# Patient Record
Sex: Female | Born: 1950 | Race: Black or African American | Hispanic: No | Marital: Single | Smoking: Never smoker
Health system: Southern US, Community
[De-identification: ages and names within clinical notes are randomized; demographics above are authoritative.]

## PROBLEM LIST (undated history)

## (undated) DIAGNOSIS — K5730 Diverticulosis of large intestine without perforation or abscess without bleeding: Secondary | ICD-10-CM

## (undated) DIAGNOSIS — E785 Hyperlipidemia, unspecified: Secondary | ICD-10-CM

## (undated) DIAGNOSIS — I10 Essential (primary) hypertension: Secondary | ICD-10-CM

## (undated) HISTORY — DX: Hyperlipidemia, unspecified: E78.5

## (undated) HISTORY — DX: Diverticulosis of large intestine without perforation or abscess without bleeding: K57.30

## (undated) HISTORY — DX: Essential (primary) hypertension: I10

---

## 2003-11-10 ENCOUNTER — Ambulatory Visit (HOSPITAL_COMMUNITY): Admission: RE | Admit: 2003-11-10 | Discharge: 2003-11-10 | Payer: Self-pay | Admitting: Internal Medicine

## 2007-11-15 ENCOUNTER — Emergency Department (HOSPITAL_COMMUNITY): Admission: EM | Admit: 2007-11-15 | Discharge: 2007-11-15 | Payer: Self-pay | Admitting: Emergency Medicine

## 2008-03-17 ENCOUNTER — Ambulatory Visit (HOSPITAL_COMMUNITY): Admission: RE | Admit: 2008-03-17 | Discharge: 2008-03-17 | Payer: Self-pay | Admitting: *Deleted

## 2010-09-01 ENCOUNTER — Emergency Department (HOSPITAL_COMMUNITY)
Admission: EM | Admit: 2010-09-01 | Discharge: 2010-09-01 | Disposition: A | Discharge status: Home / Self Care | Payer: BC Managed Care – PPO | Attending: Emergency Medicine | Admitting: Emergency Medicine

## 2010-09-01 ENCOUNTER — Emergency Department (HOSPITAL_COMMUNITY): Payer: BC Managed Care – PPO

## 2010-09-01 DIAGNOSIS — I10 Essential (primary) hypertension: Secondary | ICD-10-CM | POA: Insufficient documentation

## 2010-09-01 DIAGNOSIS — Z79899 Other long term (current) drug therapy: Secondary | ICD-10-CM | POA: Insufficient documentation

## 2010-09-01 DIAGNOSIS — M25569 Pain in unspecified knee: Secondary | ICD-10-CM | POA: Insufficient documentation

## 2010-09-01 DIAGNOSIS — G8929 Other chronic pain: Secondary | ICD-10-CM | POA: Insufficient documentation

## 2010-11-23 NOTE — Op Note (Signed)
NAME:  Elaine Houston, Elaine Houston                        ACCOUNT NO.:  [id number]   MEDICAL RECORD NO.:  [id number]                   PATIENT TYPE:  AMB   LOCATION:  DAY                                  FACILITY:  [facility]   PHYSICIAN:  R. Roetta Sessions, M.D.              DATE OF BIRTH:  04-Jan-1951   DATE OF PROCEDURE:  11/10/2003  DATE OF DISCHARGE:                                 OPERATIVE REPORT   PROCEDURE:  Screening colonoscopy.   ENDOSCOPIST:  Gerrit Friends. Rourk, M.D.   INDICATIONS FOR PROCEDURE:  The patient is a 60 year old lady seen over at  the courtesy of Dr. Nicoletta Ba for colorectal cancer screening.  She is  devoid of any lower GI tract symptoms.  She has never had her colon imaged  previously.  There is no family history of colorectal neoplasia.  Colonoscopy is now being done as a screening maneuver.  This approach has  been discussed with the patient at length.  The potential risks, benefits,  and alternatives have been reviewed; questions answered.  She is agreeable.  Please see my handwritten H&P.   PROCEDURE NOTE:  O2 saturation, blood pressure, pulse and respirations were  monitored throughout the entirety of the  procedure.  Conscious sedation:  Versed 3 mg IV, Demerol 75 mg IV in divided doses.   INSTRUMENT:  Olympus video chip adult colonoscope.   FINDINGS:  Digital rectal exam revealed no abnormalities.   ENDOSCOPIC FINDINGS:  It was unfortunately suboptimal in the transverse and  right colon.   RECTUM:  Examination of the rectal mucosa including the retroflex view of  the anal verge revealed no abnormalities.   COLON:  The colonic mucosa was surveyed from the rectosigmoid junction  through the left transverse and right colon to the area of the appendiceal  orifice, ileocecal valve, and cecum.  These structures were well seen and  photographed for the record.   From this level the scope was slowly withdrawn.  All previously mentioned  mucosal surfaces were  again seen.  There was chunky vegetable matter in the  transverse and right colon which had to be washed and suctioned.  This  material repeatedly clogged up the suction channel on the scope.  The  colonic mucosa was fairly well seen; however, all of the fecal debris could  not be cleared.  The patient was noted to have sigmoid diverticula, but no  other gross abnormalities were observed.  The patient tolerated the  procedure well and was reacted in endoscopy.   IMPRESSION:  1. Normal rectum.  2. Sigmoid diverticula.  3. The remainder of the colonic mucosa appeared normal; however, suboptimal     prep made the exam more difficult.   RECOMMENDATIONS:  1. Yearly rectal exams/Hemoccults at the time of her annual physical.  2. I have recommended that she come back somewhat early for her next     screening colonoscopy i.e. 5  years.  3. She is to follow up with Dr. Milinda Cave as directed by him.      ___________________________________________                                            Jonathon Bellows, M.D.   RMR/MEDQ  D:  11/10/2003  T:  11/10/2003  Job:  [id number]   cc:   Jeoffrey Massed, M.D.  [address]  [place]  [place] [address]  Fax: [phone]   R. Roetta Sessions, M.D.  [address]  [place]  [place] [address]  Fax: [phone]

## 2012-05-19 ENCOUNTER — Encounter: Payer: Self-pay | Admitting: Family Medicine

## 2012-05-19 ENCOUNTER — Ambulatory Visit (INDEPENDENT_AMBULATORY_CARE_PROVIDER_SITE_OTHER): Payer: BC Managed Care – PPO | Admitting: Family Medicine

## 2012-05-19 VITALS — BP 160/90 | HR 74 | Resp 15 | Ht 62.5 in | Wt 202.0 lb

## 2012-05-19 DIAGNOSIS — E785 Hyperlipidemia, unspecified: Secondary | ICD-10-CM

## 2012-05-19 DIAGNOSIS — Z23 Encounter for immunization: Secondary | ICD-10-CM

## 2012-05-19 DIAGNOSIS — M179 Osteoarthritis of knee, unspecified: Secondary | ICD-10-CM | POA: Insufficient documentation

## 2012-05-19 DIAGNOSIS — M1710 Unilateral primary osteoarthritis, unspecified knee: Secondary | ICD-10-CM

## 2012-05-19 DIAGNOSIS — E669 Obesity, unspecified: Secondary | ICD-10-CM

## 2012-05-19 DIAGNOSIS — IMO0002 Reserved for concepts with insufficient information to code with codable children: Secondary | ICD-10-CM

## 2012-05-19 DIAGNOSIS — I10 Essential (primary) hypertension: Secondary | ICD-10-CM

## 2012-05-19 NOTE — Patient Instructions (Addendum)
Continue current medications I will obtain records Nice to meet you! F/U 3 months

## 2012-05-19 NOTE — Progress Notes (Signed)
  Subjective:    Patient ID: Elaine Houston, female    DOB: 1950/10/24, 61 y.o.   MRN: [id number]  HPI Patient here to establish care. Previous PCP Dr. Stephania Fragmin [facility]. Medications and history reviewed no specific concerns today. Hypertension for the past 20 years. She states her blood pressure is well-controlled on her medications however she did not take them this morning. Hyperlipidemia on simvastatin. She has osteoarthritis of her left knee which she takes Naprosyn for some time she gets pain up to her hip and down into her ankle. She works second shift at [employer].  Colonoscopy - 2005, needed repeat in 5 years Mammograms done at [facility] at her job PAP Smear UTD TDAP given 2009  Review of Systems  GEN- denies fatigue, fever, weight loss,weakness, recent illness HEENT- denies eye drainage, change in vision, nasal discharge, CVS- denies chest pain, palpitations RESP- denies SOB, cough, wheeze ABD- denies N/V, change in stools, abd pain GU- denies dysuria, hematuria, dribbling, incontinence MSK- denies joint pain, muscle aches, injury Neuro- denies headache, dizziness, syncope, seizure activity      Objective:   Physical Exam GEN- NAD, alert and oriented x3 HEENT- PERRL, EOMI, non injected sclera, pink conjunctiva, MMM, oropharynx clear, dentures Neck- Supple,  CVS- RRR, no murmur RESP-CTAB ABS-NABS,soft,NT,ND EXT- No edema Pulses- Radial, DP- 2+ Psych-normal affect and mood        Assessment & Plan:

## 2012-05-19 NOTE — Assessment & Plan Note (Signed)
Continue BP meds, no meds taken this AM  Obtain records

## 2012-05-19 NOTE — Assessment & Plan Note (Signed)
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

## 2012-05-19 NOTE — Assessment & Plan Note (Signed)
Continue statin therapy.

## 2012-05-29 ENCOUNTER — Telehealth: Payer: Self-pay | Admitting: Family Medicine

## 2012-05-29 MED ORDER — SIMVASTATIN 20 MG PO TABS: 20.0000 mg | ORAL_TABLET | Freq: Every evening | ORAL | Status: DC

## 2012-05-29 MED ORDER — HYDROCHLOROTHIAZIDE 25 MG PO TABS: 25.0000 mg | ORAL_TABLET | Freq: Every day | ORAL | Status: DC

## 2012-05-29 NOTE — Telephone Encounter (Signed)
meds sent

## 2012-08-18 ENCOUNTER — Ambulatory Visit: Payer: BC Managed Care – PPO | Admitting: Family Medicine

## 2012-09-01 ENCOUNTER — Encounter: Payer: Self-pay | Admitting: Family Medicine

## 2012-09-01 ENCOUNTER — Ambulatory Visit (INDEPENDENT_AMBULATORY_CARE_PROVIDER_SITE_OTHER): Payer: BC Managed Care – PPO | Admitting: Family Medicine

## 2012-09-01 VITALS — BP 134/78 | HR 60 | Resp 18 | Ht 62.5 in | Wt 204.1 lb

## 2012-09-01 DIAGNOSIS — Z1211 Encounter for screening for malignant neoplasm of colon: Secondary | ICD-10-CM

## 2012-09-01 DIAGNOSIS — E785 Hyperlipidemia, unspecified: Secondary | ICD-10-CM

## 2012-09-01 DIAGNOSIS — M1710 Unilateral primary osteoarthritis, unspecified knee: Secondary | ICD-10-CM

## 2012-09-01 DIAGNOSIS — Z1321 Encounter for screening for nutritional disorder: Secondary | ICD-10-CM

## 2012-09-01 DIAGNOSIS — IMO0002 Reserved for concepts with insufficient information to code with codable children: Secondary | ICD-10-CM

## 2012-09-01 DIAGNOSIS — R0981 Nasal congestion: Secondary | ICD-10-CM

## 2012-09-01 DIAGNOSIS — E669 Obesity, unspecified: Secondary | ICD-10-CM

## 2012-09-01 DIAGNOSIS — I10 Essential (primary) hypertension: Secondary | ICD-10-CM

## 2012-09-01 DIAGNOSIS — Z130 Encounter for screening for diseases of the blood and blood-forming organs and certain disorders involving the immune mechanism: Secondary | ICD-10-CM

## 2012-09-01 DIAGNOSIS — J3489 Other specified disorders of nose and nasal sinuses: Secondary | ICD-10-CM

## 2012-09-01 DIAGNOSIS — M179 Osteoarthritis of knee, unspecified: Secondary | ICD-10-CM

## 2012-09-01 LAB — COMPREHENSIVE METABOLIC PANEL
ALT: 12 U/L (ref 0–35)
AST: 13 U/L (ref 0–37)
Albumin: 3.9 g/dL (ref 3.5–5.2)
Alkaline Phosphatase: 70 U/L (ref 39–117)
BUN: 14 mg/dL (ref 6–23)
CO2: 28 mEq/L (ref 19–32)
Calcium: 9.3 mg/dL (ref 8.4–10.5)
Chloride: 103 mEq/L (ref 96–112)
Creat: 0.62 mg/dL (ref 0.50–1.10)
Glucose, Bld: 99 mg/dL (ref 70–99)
Potassium: 3.8 mEq/L (ref 3.5–5.3)
Sodium: 139 mEq/L (ref 135–145)
Total Bilirubin: 0.4 mg/dL (ref 0.3–1.2)
Total Protein: 7.3 g/dL (ref 6.0–8.3)

## 2012-09-01 LAB — CBC
HCT: 34 % — ABNORMAL LOW (ref 36.0–46.0)
Hemoglobin: 11.5 g/dL — ABNORMAL LOW (ref 12.0–15.0)
MCH: 27.2 pg (ref 26.0–34.0)
MCHC: 33.8 g/dL (ref 30.0–36.0)
MCV: 80.4 fL (ref 78.0–100.0)
Platelets: 243 10*3/uL (ref 150–400)
RBC: 4.23 MIL/uL (ref 3.87–5.11)
RDW: 16.4 % — ABNORMAL HIGH (ref 11.5–15.5)
WBC: 6.3 10*3/uL (ref 4.0–10.5)

## 2012-09-01 LAB — LIPID PANEL
Cholesterol: 220 mg/dL — ABNORMAL HIGH (ref 0–200)
HDL: 63 mg/dL (ref >39)
LDL Cholesterol: 145 mg/dL — ABNORMAL HIGH (ref 0–99)
Total CHOL/HDL Ratio: 3.5 Ratio
Triglycerides: 60 mg/dL (ref <150)
VLDL: 12 mg/dL (ref 0–40)

## 2012-09-01 MED ORDER — SIMVASTATIN 20 MG PO TABS: 20.0000 mg | ORAL_TABLET | Freq: Every evening | ORAL | Status: DC

## 2012-09-01 MED ORDER — NAPROXEN 375 MG PO TABS: 375.0000 mg | ORAL_TABLET | Freq: Two times a day (BID) | ORAL | Status: DC

## 2012-09-01 MED ORDER — HYDROCHLOROTHIAZIDE 25 MG PO TABS: 25.0000 mg | ORAL_TABLET | Freq: Every day | ORAL | Status: AC

## 2012-09-01 MED ORDER — ENALAPRIL MALEATE 5 MG PO TABS: 5.0000 mg | ORAL_TABLET | Freq: Every day | ORAL | Status: AC

## 2012-09-01 NOTE — Assessment & Plan Note (Signed)
Well-controlled with Naprosyn and encouraged her to walk some for exercise. She will also start calcium and vitamin D once a day as she's had some constipation with this in the past

## 2012-09-01 NOTE — Patient Instructions (Addendum)
Start calcium and Vitamin D - 1 tablet a day / or look for multivitamin that has calcium (500mg ) and Vitamin D (400iU) Continue current medication Start walking some for exercise and weight loss Get the labs done fasting Nasal saline for sinuses Referral for repeat colonoscopy  F/U 4 months for Physical

## 2012-09-01 NOTE — Assessment & Plan Note (Signed)
Her symptoms have improved, advise her to use nasal saline

## 2012-09-01 NOTE — Assessment & Plan Note (Signed)
Continue Zocor she's to have fasting labs done

## 2012-09-01 NOTE — Assessment & Plan Note (Signed)
Discussed weight and need for increased activity which will also help her arthritis

## 2012-09-01 NOTE — Assessment & Plan Note (Signed)
Blood pressure is well-controlled we'll continue current medications she will have fasting labs done

## 2012-09-01 NOTE — Progress Notes (Signed)
  Subjective:    Patient ID: Elaine Houston, female    DOB: 1951-02-16, 62 y.o.   MRN: [id number]  HPI  Patient presents to follow chronic medical problems. She has had some sinus congestion and drainage over the past month but it is now improved. She is tolerating her blood pressure medications. She is due for fasting labs. She is to have mammogram at her work this in March She is due for colonoscopy  Review of Systems   GEN- denies fatigue, fever, weight loss,weakness, recent illness HEENT- denies eye drainage, change in vision, nasal discharge, CVS- denies chest pain, palpitations RESP- denies SOB, cough, wheeze ABD- denies N/V, change in stools, abd pain GU- denies dysuria, hematuria, dribbling, incontinence MSK- denies joint pain, muscle aches, injury Neuro- denies headache, dizziness, syncope, seizure activity      Objective:   Physical Exam  GEN- NAD, alert and oriented x3 HEENT- PERRL, EOMI, non injected sclera, pink conjunctiva, MMM, oropharynx clear, TM clear bilat, nares clear bilat, no sinus tenderness Neck- Supple, no bruit CVS- RRR, no murmur RESP-CTAB EXT- trace pedal edema LLE, no edema RLE  Pulses- Radial, DP- 2+       Assessment & Plan:

## 2012-09-05 LAB — VITAMIN D 1,25 DIHYDROXY
Vitamin D 1, 25 (OH)2 Total: 63 pg/mL (ref 18–72)
Vitamin D2 1, 25 (OH)2: <8 pg/mL
Vitamin D3 1, 25 (OH)2: 63 pg/mL

## 2012-09-07 MED ORDER — SIMVASTATIN 40 MG PO TABS: 40.0000 mg | ORAL_TABLET | Freq: Every evening | ORAL | Status: AC

## 2012-09-07 NOTE — Addendum Note (Signed)
Addended by: Milinda Antis F on: 09/07/2012 10:33 AM   Modules accepted: Orders

## 2012-09-14 ENCOUNTER — Telehealth: Payer: Self-pay | Admitting: Family Medicine

## 2012-09-14 NOTE — Telephone Encounter (Signed)
Called and left message for patient to return call.  

## 2012-09-15 ENCOUNTER — Telehealth: Payer: Self-pay

## 2012-09-15 NOTE — Telephone Encounter (Signed)
Pt was referred by Dr. Jeanice Lim for screening colonoscopy. Last one was 11/10/2003 by RMR and next was recommended in 5 years. OV with Gerrit Halls, NP on 10/12/2012 at 10:30 AM.

## 2012-10-12 ENCOUNTER — Ambulatory Visit: Payer: BC Managed Care – PPO | Admitting: Gastroenterology

## 2012-11-05 ENCOUNTER — Encounter (HOSPITAL_COMMUNITY): Payer: Self-pay | Admitting: *Deleted

## 2012-11-05 ENCOUNTER — Emergency Department (HOSPITAL_COMMUNITY): Payer: BC Managed Care – PPO

## 2012-11-05 ENCOUNTER — Emergency Department (HOSPITAL_COMMUNITY)
Admission: EM | Admit: 2012-11-05 | Discharge: 2012-11-05 | Disposition: A | Discharge status: Home / Self Care | Payer: BC Managed Care – PPO | Attending: Emergency Medicine | Admitting: Emergency Medicine

## 2012-11-05 DIAGNOSIS — E785 Hyperlipidemia, unspecified: Secondary | ICD-10-CM | POA: Insufficient documentation

## 2012-11-05 DIAGNOSIS — Z79899 Other long term (current) drug therapy: Secondary | ICD-10-CM | POA: Insufficient documentation

## 2012-11-05 DIAGNOSIS — I10 Essential (primary) hypertension: Secondary | ICD-10-CM | POA: Insufficient documentation

## 2012-11-05 DIAGNOSIS — Y9389 Activity, other specified: Secondary | ICD-10-CM | POA: Insufficient documentation

## 2012-11-05 DIAGNOSIS — Z8719 Personal history of other diseases of the digestive system: Secondary | ICD-10-CM | POA: Insufficient documentation

## 2012-11-05 DIAGNOSIS — Y9289 Other specified places as the place of occurrence of the external cause: Secondary | ICD-10-CM | POA: Insufficient documentation

## 2012-11-05 DIAGNOSIS — W010XXA Fall on same level from slipping, tripping and stumbling without subsequent striking against object, initial encounter: Secondary | ICD-10-CM | POA: Insufficient documentation

## 2012-11-05 DIAGNOSIS — S99929A Unspecified injury of unspecified foot, initial encounter: Secondary | ICD-10-CM | POA: Insufficient documentation

## 2012-11-05 DIAGNOSIS — S8992XA Unspecified injury of left lower leg, initial encounter: Secondary | ICD-10-CM

## 2012-11-05 DIAGNOSIS — S8990XA Unspecified injury of unspecified lower leg, initial encounter: Secondary | ICD-10-CM | POA: Insufficient documentation

## 2012-11-05 MED ORDER — HYDROCODONE-ACETAMINOPHEN 5-325 MG PO TABS: 1.0000 | ORAL_TABLET | ORAL | Status: DC | PRN

## 2012-11-05 NOTE — ED Notes (Signed)
Tripped and fell going up steps, on Tuesday.  Pain lt knee and lower leg.

## 2012-11-05 NOTE — ED Provider Notes (Signed)
History     CSN: [id number]  Arrival date & time 11/05/12  2030   First MD Initiated Contact with Patient 11/05/12 2257      Chief Complaint  Patient presents with  . Leg Pain    (Consider location/radiation/quality/duration/timing/severity/associated sxs/prior treatment) HPI Elaine Houston is a 62 y.o. female who presents to the ED with left knee pain. She states she was going up steps 2 days ago and fell and hit her knee on the step. She complains of pain in the knee that radiates to the lower leg. She denies any other injuries.   Past Medical History  Diagnosis Date  . Hypertension   . Hyperlipidemia   . Sigmoid diverticulosis     Past Surgical History  Procedure Laterality Date  . Cesarean section      Family History  Problem Relation Age of Onset  . Diabetes Mother   . Arthritis Father   . Heart disease Father   . Diabetes Sister   . Diabetes Sister     History  Substance Use Topics  . Smoking status: Never Smoker   . Smokeless tobacco: Not on file  . Alcohol Use: No    OB History   Grav Para Term Preterm Abortions TAB SAB Ect Mult Living                  Review of Systems  Constitutional: Negative for fever.  Eyes: Negative for visual disturbance.  Respiratory: Negative for shortness of breath.   Gastrointestinal: Negative for nausea and vomiting.  Musculoskeletal: Joint swelling: arthritis.       Left knee and lower leg pain  Skin: Negative for wound.  Neurological: Negative for headaches.  Psychiatric/Behavioral: Negative for confusion. The patient is not nervous/anxious.     Allergies  Review of patient's allergies indicates no known allergies.  Home Medications   Current Outpatient Rx  Name  Route  Sig  Dispense  Refill  . enalapril (VASOTEC) 5 MG tablet   Oral   Take 1 tablet (5 mg total) by mouth daily.   30 tablet   4   . hydrochlorothiazide (HYDRODIURIL) 25 MG tablet   Oral   Take 1 tablet (25 mg total) by mouth daily.   30  tablet   4   . ibuprofen (ADVIL,MOTRIN) 200 MG tablet   Oral   Take 400 mg by mouth once as needed for pain.         . naproxen (NAPROSYN) 375 MG tablet   Oral   Take 1 tablet (375 mg total) by mouth 2 (two) times daily with a meal.   60 tablet   4   . simvastatin (ZOCOR) 40 MG tablet   Oral   Take 1 tablet (40 mg total) by mouth every evening.   30 tablet   4     Change in dose     BP 152/80  Pulse 64  Temp(Src) 97.6 F (36.4 C) (Oral)  Resp 18  Ht 5' 2.5" (1.588 m)  Wt 204 lb (92.534 kg)  BMI 36.69 kg/m2  SpO2 100%  Physical Exam  Nursing note and vitals reviewed. Constitutional: She is oriented to person, place, and time. She appears well-developed and well-nourished. No distress.  HENT:  Head: Normocephalic.  Eyes: EOM are normal.  Neck: Neck supple.  Cardiovascular: Normal rate.   Pulmonary/Chest: Effort normal.  Musculoskeletal:       Right knee: She exhibits decreased range of motion and swelling. She exhibits  no laceration, no erythema, no LCL laxity, normal patellar mobility and no MCL laxity. Tenderness found. MCL and LCL tenderness noted.       Legs: The pain in the left knee radiates to the left foot. The patient states that she has arthritis in her foot and ankle so she is not sure if the pain is related to the fall.  Neurological: She is alert and oriented to person, place, and time. She has normal strength. No cranial nerve deficit or sensory deficit.  Pedal pulses equal bilateral, adequate circulation, good touch sensation.   Skin: Skin is warm and dry.  Psychiatric: She has a normal mood and affect. Her behavior is normal. Judgment and thought content normal.    ED Course  Procedures (including critical care time)  Labs Reviewed - No data to display Dg Knee Complete 4 Views Left  11/05/2012  *RADIOLOGY REPORT*  Clinical Data: Pain and swelling to the left knee after fall upstairs 2 days ago.  LEFT KNEE - COMPLETE 4+ VIEW  Comparison:  09/01/2010  Findings: Tricompartmental degenerative changes in the left knee, most commonly involving the medial and patellofemoral compartments. Degenerative changes appear stable since the previous study.  No evidence of acute fracture or subluxation.  No focal bone lesion or bone destruction.  Bone cortex and trabecular architecture appear intact.  No significant effusion.  IMPRESSION: Degenerative changes in the left knee.  No acute bony abnormalities identified.   Original Report Authenticated By: Burman Nieves, M.D.   Assessment: 62 y.o. female with pain and swelling to the left knee s/p fall   Knee effusion  Plan:  Knee immobilizer   Pain management   Follow up with Dr. Hilda Lias  MDM  I have reviewed this patient's vital signs, nurses notes, appropriate imaging and discussed findings with the patient and plan of care. Patient voices understanding.   Medication List    TAKE these medications       HYDROcodone-acetaminophen 5-325 MG per tablet  Commonly known as:  NORCO/VICODIN  Take 1 tablet by mouth every 4 (four) hours as needed.      ASK your doctor about these medications       enalapril 5 MG tablet  Commonly known as:  VASOTEC  Take 1 tablet (5 mg total) by mouth daily.     hydrochlorothiazide 25 MG tablet  Commonly known as:  HYDRODIURIL  Take 1 tablet (25 mg total) by mouth daily.     ibuprofen 200 MG tablet  Commonly known as:  ADVIL,MOTRIN  Take 400 mg by mouth once as needed for pain.     naproxen 375 MG tablet  Commonly known as:  NAPROSYN  Take 1 tablet (375 mg total) by mouth 2 (two) times daily with a meal.     simvastatin 40 MG tablet  Commonly known as:  ZOCOR  Take 1 tablet (40 mg total) by mouth every evening.                 [facility] [name], [place] 11/06/12 0127

## 2012-11-05 NOTE — ED Notes (Signed)
Pt alert & oriented x4, stable gait. Patient given discharge instructions, paperwork & prescription(s). Patient  instructed to stop at the registration desk to finish any additional paperwork. Patient verbalized understanding. Pt left department w/ no further questions. 

## 2012-11-05 NOTE — ED Notes (Addendum)
Pt states she tripped Tuesday & hit her knee, pt tried to work today but could not deal w/ the pain. Pt states seems like the pain in her knee has gone to her ankle, but pt denies hurting ankle.

## 2012-11-06 NOTE — ED Provider Notes (Signed)
Medical screening examination/treatment/procedure(s) were performed by non-physician practitioner and as supervising physician I was immediately available for consultation/collaboration.   Shelda Jakes, MD 11/06/12 1520

## 2012-12-29 ENCOUNTER — Encounter: Payer: BC Managed Care – PPO | Admitting: Family Medicine

## 2012-12-29 ENCOUNTER — Encounter: Payer: Self-pay | Admitting: Family Medicine

## 2015-04-29 ENCOUNTER — Encounter (HOSPITAL_COMMUNITY): Payer: Self-pay | Admitting: *Deleted

## 2015-04-29 ENCOUNTER — Emergency Department (HOSPITAL_COMMUNITY)
Admission: EM | Admit: 2015-04-29 | Discharge: 2015-04-29 | Disposition: A | Discharge status: Home / Self Care | Payer: BLUE CROSS/BLUE SHIELD | Attending: Emergency Medicine | Admitting: Emergency Medicine

## 2015-04-29 DIAGNOSIS — G8929 Other chronic pain: Secondary | ICD-10-CM | POA: Insufficient documentation

## 2015-04-29 DIAGNOSIS — D1724 Benign lipomatous neoplasm of skin and subcutaneous tissue of left leg: Secondary | ICD-10-CM | POA: Diagnosis not present

## 2015-04-29 DIAGNOSIS — Z79899 Other long term (current) drug therapy: Secondary | ICD-10-CM | POA: Insufficient documentation

## 2015-04-29 DIAGNOSIS — M1990 Unspecified osteoarthritis, unspecified site: Secondary | ICD-10-CM

## 2015-04-29 DIAGNOSIS — M1712 Unilateral primary osteoarthritis, left knee: Secondary | ICD-10-CM | POA: Insufficient documentation

## 2015-04-29 DIAGNOSIS — E785 Hyperlipidemia, unspecified: Secondary | ICD-10-CM | POA: Insufficient documentation

## 2015-04-29 DIAGNOSIS — Z791 Long term (current) use of non-steroidal anti-inflammatories (NSAID): Secondary | ICD-10-CM | POA: Diagnosis not present

## 2015-04-29 DIAGNOSIS — M25562 Pain in left knee: Secondary | ICD-10-CM

## 2015-04-29 DIAGNOSIS — Z8719 Personal history of other diseases of the digestive system: Secondary | ICD-10-CM | POA: Insufficient documentation

## 2015-04-29 DIAGNOSIS — I10 Essential (primary) hypertension: Secondary | ICD-10-CM | POA: Insufficient documentation

## 2015-04-29 MED ORDER — MELOXICAM 7.5 MG PO TABS
7.5000 mg | ORAL_TABLET | Freq: Every day | ORAL | Status: DC
Start: 2015-04-29 — End: 2015-09-12

## 2015-04-29 NOTE — Discharge Instructions (Signed)
Arthritis Arthritis is a term that is commonly used to refer to joint pain or joint disease. There are more than 100 types of arthritis. CAUSES The most common cause of this condition is wear and tear of a joint. Other causes include:  Gout.  Inflammation of a joint.  An infection of a joint.  Sprains and other injuries near the joint.  A drug reaction or allergic reaction. In some cases, the cause may not be known. SYMPTOMS The main symptom of this condition is pain in the joint with movement. Other symptoms include:  Redness, swelling, or stiffness at a joint.  Warmth coming from the joint.  Fever.  Overall feeling of illness. DIAGNOSIS This condition may be diagnosed with a physical exam and tests, including:  Blood tests.  Urine tests.  Imaging tests, such as MRI, X-rays, or a CT scan. Sometimes, fluid is removed from a joint for testing. TREATMENT Treatment for this condition may involve:  Treatment of the cause, if it is known.  Rest.  Raising (elevating) the joint.  Applying cold or hot packs to the joint.  Medicines to improve symptoms and reduce inflammation.  Injections of a steroid such as cortisone into the joint to help reduce pain and inflammation. Depending on the cause of your arthritis, you may need to make lifestyle changes to reduce stress on your joint. These changes may include exercising more and losing weight. HOME CARE INSTRUCTIONS Medicines  Take over-the-counter and prescription medicines only as told by your health care provider.  Do not take aspirin to relieve pain if gout is suspected. Activities  Rest your joint if told by your health care provider. Rest is important when your disease is active and your joint feels painful, swollen, or stiff.  Avoid activities that make the pain worse. It is important to balance activity with rest.  Exercise your joint regularly with range-of-motion exercises as told by your health care  provider. Try doing low-impact exercise, such as:  Swimming.  Water aerobics.  Biking.  Walking. Joint Care  If your joint is swollen, keep it elevated if told by your health care provider.  If your joint feels stiff in the morning, try taking a warm shower.  If directed, apply heat to the joint. If you have diabetes, do not apply heat without permission from your health care provider.  Put a towel between the joint and the hot pack or heating pad.  Leave the heat on the area for 20-30 minutes.  If directed, apply ice to the joint:  Put ice in a plastic bag.  Place a towel between your skin and the bag.  Leave the ice on for 20 minutes, 2-3 times per day.  Keep all follow-up visits as told by your health care provider. This is important. SEEK MEDICAL CARE IF:  The pain gets worse.  You have a fever. SEEK IMMEDIATE MEDICAL CARE IF:  You develop severe joint pain, swelling, or redness.  Many joints become painful and swollen.  You develop severe back pain.  You develop severe weakness in your leg.  You cannot control your bladder or bowels.   This information is not intended to replace advice given to you by your health care provider. Make sure you discuss any questions you have with your health care provider.   Document Released: 08/01/2004 Document Revised: 03/15/2015 Document Reviewed: 09/19/2014 Elsevier Interactive Patient Education 2016 [employer]  Lipoma A lipoma is a noncancerous (benign) tumor that is made up of fat  cells. This is a very common type of soft-tissue growth. Lipomas are usually found under the skin (subcutaneous). They may occur in any tissue of the body that contains fat. Common areas for lipomas to appear include the back, shoulders, buttocks, and thighs. Lipomas grow slowly, and they are usually painless. Most lipomas do not cause problems and do not require treatment. CAUSES The cause of this condition is not known. RISK  FACTORS This condition is more likely to develop in:  People who are 44-50 years old.  People who have a family history of lipomas. SYMPTOMS A lipoma usually appears as a small, round bump under the skin. It may feel soft or rubbery, but the firmness can vary. Most lipomas are not painful. However, a lipoma may become painful if it is located in an area where it pushes on nerves. DIAGNOSIS A lipoma can usually be diagnosed with a physical exam. You may also have tests to confirm the diagnosis and to rule out other conditions. Tests may include:  Imaging tests, such as a CT scan or MRI.  Removal of a tissue sample to be looked at under a microscope (biopsy). TREATMENT Treatment is not needed for small lipomas that are not causing problems. If a lipoma continues to get bigger or it causes problems, removal is often the best option. Lipomas can also be removed to improve appearance. Removal of a lipoma is usually done with a surgery in which the fatty cells and the surrounding capsule are removed. Most often, a medicine that numbs the area (local anesthetic) is used for this procedure. HOME CARE INSTRUCTIONS  Keep all follow-up visits as directed by your health care provider. This is important. SEEK MEDICAL CARE IF:  Your lipoma becomes larger or hard.  Your lipoma becomes painful, red, or increasingly swollen. These could be signs of infection or a more serious condition.   This information is not intended to replace advice given to you by your health care provider. Make sure you discuss any questions you have with your health care provider.   Document Released: 06/14/2002 Document Revised: 11/08/2014 Document Reviewed: 06/20/2014 Elsevier Interactive Patient Education [employer].

## 2015-04-29 NOTE — ED Provider Notes (Signed)
CSN: [id number]     Arrival date & time 04/29/15  1650 History   First MD Initiated Contact with Patient 04/29/15 1720     Chief Complaint  Patient presents with  . Knee Pain     (Consider location/radiation/quality/duration/timing/severity/associated sxs/prior Treatment) The history is provided by the patient.   Elaine Houston is a 64 y.o. female with complaint of chronic left knee pain with known arthritis in the knee.  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  She endorses swelling in the knee which is worsened after a shift, and improves with ice treatment.  She has had multiple medications including a prednisone taper which she did not tolerate well, both prescription Naprosyn which did not relieve her pain and currently taking Aleve which helped initially but is no longer controlling her pain.  She is planning to see Dr. Aline Brochure regarding her chronic knee pain, has not yet made an appointment.  She has noticed a nodule at her left hip and is questioning whether this may be related to her hip pain.  The area in question is nontender, she noticed it within this past year, but has slowly enlarged in size.  Past Medical History  Diagnosis Date  . Hypertension   . Hyperlipidemia   . Sigmoid diverticulosis    Past Surgical History  Procedure Laterality Date  . Cesarean section     Family History  Problem Relation Age of Onset  . Diabetes Mother   . Arthritis Father   . Heart disease Father   . Diabetes Sister   . Diabetes Sister    Social History  Substance Use Topics  . Smoking status: Never Smoker   . Smokeless tobacco: None  . Alcohol Use: No   OB History    No data available     Review of Systems  Constitutional: Negative for fever.  Musculoskeletal: Positive for joint swelling and arthralgias. Negative for myalgias.  Skin:       Negative except as mentioned in HPI.    Neurological: Negative for  weakness and numbness.      Allergies  Review of patient's allergies indicates no known allergies.  Home Medications   Prior to Admission medications   Medication Sig Start Date End Date Taking? Authorizing Provider  enalapril (VASOTEC) 5 MG tablet Take 1 tablet (5 mg total) by mouth daily. 09/01/12   Alycia Rossetti, MD  hydrochlorothiazide (HYDRODIURIL) 25 MG tablet Take 1 tablet (25 mg total) by mouth daily. 09/01/12   Alycia Rossetti, MD  HYDROcodone-acetaminophen (NORCO/VICODIN) 5-325 MG per tablet Take 1 tablet by mouth every 4 (four) hours as needed. 11/05/12   Hope Bunnie Pion, NP  ibuprofen (ADVIL,MOTRIN) 200 MG tablet Take 400 mg by mouth once as needed for pain.    Historical Provider, MD  meloxicam (MOBIC) 7.5 MG tablet Take 1-2 tablets (7.5-15 mg total) by mouth daily. 04/29/15   Evalee Jefferson, PA-C  naproxen (NAPROSYN) 375 MG tablet Take 1 tablet (375 mg total) by mouth 2 (two) times daily with a meal. 09/01/12   Alycia Rossetti, MD  simvastatin (ZOCOR) 40 MG tablet Take 1 tablet (40 mg total) by mouth every evening. 09/07/12   Alycia Rossetti, MD   BP 140/68 mmHg  Pulse 64  Temp(Src) 98.1 F (36.7 C) (Oral)  Resp 18  Ht 5\' 2"  (1.575 m)  Wt 204 lb (92.534 kg)  BMI 37.30 kg/m2  SpO2  100% Physical Exam  Constitutional: She appears well-developed and well-nourished.  HENT:  Head: Atraumatic.  Neck: Normal range of motion.  Cardiovascular:  Pulses equal bilaterally  Musculoskeletal: She exhibits tenderness.       Left knee: She exhibits no swelling, no effusion, no erythema, no LCL laxity and no MCL laxity. Tenderness found. Medial joint line tenderness noted.  No crepitus with range of motion.  No appreciable edema or effusion.  Neurological: She is alert. She has normal strength. She displays normal reflexes. No sensory deficit.  Skin: Skin is warm and dry.  Patient has a small nontender mobile rubbery nodule in her left upper anterior thigh consistent with a lipoma.   Psychiatric: She has a normal mood and affect.    ED Course  Procedures (including critical care time) Labs Review Labs Reviewed - No data to display  Imaging Review No results found. I have personally reviewed and evaluated these images and lab results as part of my medical decision-making.   EKG Interpretation None      MDM   Final diagnoses:  Chronic knee pain, left  Arthritis  Lipoma of left lower extremity    Patient was encouraged to follow-up with Dr. Aline Brochure as she intends.  She was prescribed Mobic in place of her Aleve, encouraged continued ice and/or heat therapy for the chronic knee pain.  X-rays from a prior visit were reviewed confirming mild osteoarthritis.  No injury today indicating need for repeat imaging.  Reassurance given regarding lipoma.  Patient was advised to follow-up with her PCP however if it continues to enlarge or becomes tender.    Evalee Jefferson, PA-C 04/29/15 1858  Ezequiel Essex, MD 04/29/15 Joen Laura

## 2015-04-29 NOTE — ED Notes (Signed)
Chronic left knee pain

## 2015-07-12 ENCOUNTER — Encounter: Payer: Self-pay | Admitting: *Deleted

## 2015-08-17 ENCOUNTER — Ambulatory Visit: Payer: BLUE CROSS/BLUE SHIELD | Admitting: Orthopedic Surgery

## 2015-08-21 ENCOUNTER — Ambulatory Visit: Payer: BLUE CROSS/BLUE SHIELD | Admitting: Orthopedic Surgery

## 2015-09-12 ENCOUNTER — Ambulatory Visit (INDEPENDENT_AMBULATORY_CARE_PROVIDER_SITE_OTHER): Payer: BLUE CROSS/BLUE SHIELD

## 2015-09-12 ENCOUNTER — Ambulatory Visit (INDEPENDENT_AMBULATORY_CARE_PROVIDER_SITE_OTHER): Payer: BLUE CROSS/BLUE SHIELD | Admitting: Orthopedic Surgery

## 2015-09-12 ENCOUNTER — Encounter: Payer: Self-pay | Admitting: Orthopedic Surgery

## 2015-09-12 VITALS — BP 145/85 | Ht 62.0 in | Wt 206.0 lb

## 2015-09-12 DIAGNOSIS — M25562 Pain in left knee: Secondary | ICD-10-CM

## 2015-09-12 NOTE — Progress Notes (Signed)
Patient ID: Elaine Houston, female   DOB: Jun 24, 1951, 65 y.o.   MRN: [id number]   Chief Complaint  Patient presents with  . Knee Pain    er follow up left knee pain    Elaine Houston is a 65 y.o. female.   HPI Presents for evaluation of ongoing left knee pain times many years. She reports diffuse knee pain and swelling with stiffness associated with some tingling and back pain and left hip pain along with throbbing aching pain in the front of her knee which is constant and 9 out of 10 and unrelieved by taking naproxen preparation Aleve. She reports increased pain with working and standing for long periods of time  Night sweats sinusitis irregular heartbeat ankle leg edema heat intolerance joint pain muscle weakness back pain swollen joints stiff joints food allergies lightheadedness tingling.  Review of Systems All other systems were reviewed and were normal  Past Medical History  Diagnosis Date  . Hypertension   . Hyperlipidemia   . Sigmoid diverticulosis     No Known Allergies  Current Outpatient Prescriptions  Medication Sig Dispense Refill  . enalapril (VASOTEC) 5 MG tablet Take 1 tablet (5 mg total) by mouth daily. 30 tablet 4  . hydrochlorothiazide (HYDRODIURIL) 25 MG tablet Take 1 tablet (25 mg total) by mouth daily. 30 tablet 4  . Multiple Vitamin (MULTIVITAMIN) tablet Take 1 tablet by mouth daily.    . naproxen sodium (ANAPROX) 220 MG tablet Take 220 mg by mouth 2 (two) times daily with a meal.    . simvastatin (ZOCOR) 40 MG tablet Take 1 tablet (40 mg total) by mouth every evening. 30 tablet 4   No current facility-administered medications for this visit.   Past Surgical History  Procedure Laterality Date  . Cesarean section      Family History  Problem Relation Age of Onset  . Diabetes Mother   . Arthritis Father   . Heart disease Father   . Diabetes Sister   . Diabetes Sister     Social History  Substance Use Topics  . Smoking status: Never Smoker    . Smokeless tobacco: None  . Alcohol Use: No     Physical Exam Blood pressure 145/85, height 5\' 2"  (1.575 m), weight 206 lb (93.441 kg). Body mass index is 37.67 kg/(m^2).  Physical Exam   The patient is well developed well nourished and well groomed.   Orientation to person place and time is normal   Mood is pleasant.  Ambulatory status is remarkable for a limp involving the involved extremity         left Knee examination:  Inspection: Tenderness is noted over the medial joint line   ROM: Is limited by pain with a maximum flexion arc of 0-85  Stability: Collateral ligaments are stable, the Lachman test and anterior and posterior drawer tests are normal  We do palpate medial joint line tenderness    Motor exam: Grade 5 motor strength in the quadriceps musculature   Skin: Warm dry and intact over the right leg                       Neuro: normal sensation   Vascular: 2+ DP pulse with normal color and no edema.   Currently the opposite extremity and knee examination revealed no tenderness or swelling, 115 degrees range of motion without contracture subluxation atrophy or tremor. Normal muscle tone no instability and the neurovascular status of the limb  is normal.  The patient's upper extremities fortunately are normal in appearance normal to palpation. She has no contractures in the joints. All joints are reduced and stable. Muscle tone normal in both upper extremities. Skin normal as well. Pulses are 2+ in the radial artery and sensation is normal shows no gross lymphadenopathy and no coordination issues with fine motor testing    Assessment and Plan:  IMAGING: I have read and interpret the xrays as follows: Severe arthritis of the knee with varus deformity is mild to moderate   Diagnosis and treatment: Osteoarthritis left knee  The knee is in such bad condition that I recommend she have a knee replacement  She wants to think this over especially since she no  she will have to be out of work for 2 months. She does know a couple workers with her that had the surgery would've return to work but I told her she might not be able to

## 2015-09-12 NOTE — Patient Instructions (Signed)
Total Knee Replacement Total knee replacement is a procedure to replace your knee joint with an artificial knee joint (prosthetic knee joint). The purpose of this surgery is to reduce pain and improve your knee function. LET [facility] CARE PROVIDER KNOW ABOUT:   Any allergies you have.  All medicines you are taking, including vitamins, herbs, eye drops, creams, and over-the-counter medicines.  Previous problems you or members of your family have had with the use of anesthetics.  Any blood disorders you have.  Previous surgeries you have had.  Medical conditions you have. RISKS AND COMPLICATIONS  Generally, total knee replacement is a safe procedure. However, problems can occur, including:  Loss of range of motion of the knee or instability of the knee.  Loosening of the prosthesis.  Infection.  Persistent pain. BEFORE THE PROCEDURE   Plan to have someone take you home after the procedure.  Do not eat or drink anything after midnight on the night before the procedure or as directed by your health care provider.  Ask your health care provider about:  Changing or stopping your regular medicines. This is especially important if you are taking diabetes medicines or blood thinners.  Taking medicines such as aspirin and ibuprofen. These medicines can thin your blood. Do not take these medicines before your procedure if your health care provider asks you not to.  Ask your health care provider about how your surgical site will be marked or identified.  You may be given antibiotic medicines to help prevent infection. PROCEDURE   To reduce your risk of infection:  Your health care team will wash or sanitize their hands.  Your skin will be washed with soap.  An IV tube will be inserted into one of your veins. You will be given one or more of the following:  A medicine that makes you drowsy (sedative).  A medicine that makes you fall asleep (general anesthetic).  A medicine  injected into your spine that numbs your body below the waist (spinal anesthetic).  A medicine to block feeling in your leg (nerve block) to help ease pain after surgery.  An incision will be made in your knee. Your surgeon will take out any damaged cartilage and bone by sawing off the damaged surfaces.  The surgeon will then put a new metal liner over the sawed-off portion of your thigh bone (femur) and a plastic liner over the sawed-off portion of one of the bones of your lower leg (tibia). This is to restore alignment and function to your knee. A plastic piece is often used to restore the surface of your knee cap. AFTER THE PROCEDURE   You will stay in a recovery area until your medicines wear off.  You may have drainage tubes to drain excess fluid from your knee. These tubes attach to a device that removes these fluids.  Once you are awake, stable, and taking fluids well, you will be taken to your hospital room.  You may be directed to take actions to help prevent blood clots. These may include:  Walking shortly after surgery, with someone assisting you. Moving around after surgery helps to improve blood flow.  Wearing compression stockings or using different types of devices.  You will receive physical therapy as prescribed by your health care provider.   This information is not intended to replace advice given to you by your health care provider. Make sure you discuss any questions you have with your health care provider.   Document Released: 09/30/2000  Document Revised: 03/15/2015 Document Reviewed: 08/04/2011 Elsevier Interactive Patient Education [employer].

## 2017-07-30 ENCOUNTER — Encounter (HOSPITAL_COMMUNITY): Payer: Self-pay

## 2017-07-30 ENCOUNTER — Emergency Department (HOSPITAL_COMMUNITY): Payer: BLUE CROSS/BLUE SHIELD

## 2017-07-30 ENCOUNTER — Other Ambulatory Visit: Payer: Self-pay

## 2017-07-30 ENCOUNTER — Emergency Department (HOSPITAL_COMMUNITY)
Admission: EM | Admit: 2017-07-30 | Discharge: 2017-07-30 | Disposition: A | Discharge status: Home / Self Care | Payer: BLUE CROSS/BLUE SHIELD | Attending: Emergency Medicine | Admitting: Emergency Medicine

## 2017-07-30 DIAGNOSIS — I10 Essential (primary) hypertension: Secondary | ICD-10-CM | POA: Insufficient documentation

## 2017-07-30 DIAGNOSIS — M79605 Pain in left leg: Secondary | ICD-10-CM | POA: Insufficient documentation

## 2017-07-30 DIAGNOSIS — Z79899 Other long term (current) drug therapy: Secondary | ICD-10-CM | POA: Insufficient documentation

## 2017-07-30 MED ORDER — TRAMADOL HCL 50 MG PO TABS: 50.0000 mg | ORAL_TABLET | Freq: Four times a day (QID) | ORAL | 0 refills | Status: DC | PRN

## 2017-07-30 NOTE — ED Triage Notes (Signed)
Reports of pain behind left knee that radiates to left hip since Monday. States she has been using ibuprofen with relief from pain but not able to get pain to go away.. Worse when walking or sitting.

## 2017-07-30 NOTE — ED Notes (Signed)
Pt returned from xray

## 2017-07-30 NOTE — ED Provider Notes (Signed)
[facility] EMERGENCY DEPARTMENT Provider Note   CSN: [id number] Arrival date & time: 07/30/17  1343     History   Chief Complaint Chief Complaint  Patient presents with  . Leg Pain    HPI Elaine Houston is a 67 y.o. female with a history of known advanced osteoarthritis in her left knee presenting with a 3 day history of a deep aching pain behind her left knee which radiates to her left upper posterior thigh. It is better at rest and worsened with sitting, standing and walking.  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  She denies fevers, chills, swelling, and has had no recent long periods of inactivity.  She does have varicose veins which she states have been stable. she has seen Dr Aline Brochure who has recommended left knee replacement given her advanced arthritis which she has delayed.  She reports todays symptoms do not feel the same as her knee pain. She has taken ibuprofen without relief of pain.   The history is provided by the patient.    Past Medical History:  Diagnosis Date  . Hyperlipidemia   . Hypertension   . Sigmoid diverticulosis     Patient Active Problem List   Diagnosis Date Noted  . Sinus congestion 09/01/2012  . Essential hypertension, benign 05/19/2012  . Hyperlipidemia 05/19/2012  . OA (osteoarthritis) of knee 05/19/2012  . Obesity, unspecified 05/19/2012    Past Surgical History:  Procedure Laterality Date  . CESAREAN SECTION      OB History    No data available       Home Medications    Prior to Admission medications   Medication Sig Start Date End Date Taking? Authorizing Provider  enalapril (VASOTEC) 5 MG tablet Take 1 tablet (5 mg total) by mouth daily. 09/01/12  Yes [place], Modena Nunnery, MD  hydrochlorothiazide (HYDRODIURIL) 25 MG tablet Take 1 tablet (25 mg total) by mouth daily. 09/01/12  Yes , Modena Nunnery, MD  meloxicam (MOBIC) 15 MG tablet Take 15 mg by mouth  daily.   Yes [provider]  Multiple Vitamin (MULTIVITAMIN) tablet Take 1 tablet by mouth daily.   Yes [provider]  naproxen sodium (ANAPROX) 220 MG tablet Take 220 mg by mouth 2 (two) times daily with a meal.   Yes [provider]  simvastatin (ZOCOR) 40 MG tablet Take 1 tablet (40 mg total) by mouth every evening. 09/07/12  Yes , Modena Nunnery, MD  traMADol (ULTRAM) 50 MG tablet Take 1 tablet (50 mg total) by mouth every 6 (six) hours as needed. 07/30/17   Evalee Jefferson, PA-C    Family History Family History  Problem Relation Age of Onset  . Diabetes Mother   . Arthritis Father   . Heart disease Father   . Diabetes Sister   . Diabetes Sister     Social History Social History   Tobacco Use  . Smoking status: Never Smoker  . Smokeless tobacco: Never Used  Substance Use Topics  . Alcohol use: No  . Drug use: No     Allergies   Patient has no known allergies.   Review of Systems Review of Systems  Constitutional: Negative for fever.  Musculoskeletal: Positive for arthralgias. Negative for joint swelling and myalgias.  Skin: Negative.   Neurological: Negative for weakness and numbness.     Physical Exam Updated Vital Signs BP 130/61 (BP Location: Left Arm)  Pulse 83   Temp 98.4 F (36.9 C) (Oral)   Resp 18   Ht 5\' 2"  (1.575 m)   Wt 94.3 kg (208 lb)   SpO2 100%   BMI 38.04 kg/m   Physical Exam  Constitutional: She appears well-developed and well-nourished.  HENT:  Head: Atraumatic.  Neck: Normal range of motion.  Cardiovascular:  Pulses:      Dorsalis pedis pulses are 2+ on the right side, and 2+ on the left side.  Pulses equal bilaterally. No ankle or foot edema.  Musculoskeletal: She exhibits tenderness. She exhibits no edema or deformity.       Left knee: She exhibits no effusion, no deformity and no erythema.  Advanced dilated varicose veins lateral to the left patella and in the distal anterior thigh. No obvious edema  of the knee including the popliteal space, but with ttp posterior knee.  No palpable cords or erythema. No thigh tenderness or edema.   Neurological: She is alert. She has normal strength. She displays normal reflexes. No sensory deficit.  Skin: Skin is warm and dry.  Psychiatric: She has a normal mood and affect.     ED Treatments / Results  Labs (all labs ordered are listed, but only abnormal results are displayed) Labs Reviewed - No data to display  EKG  EKG Interpretation None       Radiology Dg Knee Complete 4 Views Left  Result Date: 07/30/2017 CLINICAL DATA:  Pain behind LEFT knee radiating to LEFT hip since Monday, pain worse with walking and sitting EXAM: LEFT KNEE - COMPLETE 4+ VIEW COMPARISON:  09/12/2015 FINDINGS: Mild osseous demineralization. Tricompartmental osteoarthritic changes with joint space narrowing and spur formation greatest at medial compartment. No acute fracture, dislocation, or bone destruction. No knee joint effusion. Serpiginous soft tissue densities are identified at the lateral LEFT leg extending from the distal thigh to proximal lower leg likely representing venous varicosities. IMPRESSION: Tricompartmental osteoarthritic changes of the LEFT knee without acute bony abnormalities. 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 Electronically Signed   By: Lavonia Dana M.D.   On: 07/30/2017 16:53   Dg Hip Unilat W Or Wo Pelvis 2-3 Views Left  Result Date: 07/30/2017 CLINICAL DATA:  Pain behind LEFT knee radiating to LEFT hip since Monday, pain worse with walking and sitting EXAM: DG HIP (WITH OR WITHOUT PELVIS) 2-3V LEFT COMPARISON:  None FINDINGS: Low normal osseous mineralization. Hip and SI joint spaces preserved. No acute fracture, dislocation, or bone destruction. IMPRESSION: No acute osseous abnormalities. Electronically Signed   By: Lavonia Dana M.D.   On: 07/30/2017 16:57    Procedures Procedures (including critical care time)  Medications  Ordered in ED Medications - No data to display   Initial Impression / Assessment and Plan / ED Course  I have reviewed the triage vital signs and the nursing notes.  Pertinent labs & imaging results that were available during my care of the patient were reviewed by me and considered in my medical decision making (see chart for details).     Suspect patient has worsening of her left knee osteoarthritis, possibly with radiation of pain given her hip images are negative for djd.  With her increased risk for dvt/phlebitis given her significant varicose veins, venous US was ordered to rule out dvt. This was discussed with Dr Sabra Heck.  Pt placed on tramadol, discussed heat tx, activities as tolerated. Further recommendations if her [place] is positive, otherwise plan f/u with Dr Aline Brochure.   Final  Clinical Impressions(s) / ED Diagnoses   Final diagnoses:  Left leg pain    ED Discharge Orders        Ordered    traMADol (ULTRAM) 50 MG tablet  Every 6 hours PRN     07/30/17 1747    US Venous Img Lower Unilateral Left     07/30/17 1748       Evalee Jefferson, PA-C 07/30/17 2200    Noemi Chapel, MD 08/01/17 1039

## 2017-07-30 NOTE — Discharge Instructions (Signed)
As discussed, call the number in the morning to schedule your appointment for your ultrasound. You may take the tramadol prescribed for pain relief.  This will make you drowsy - do not drive within 4 hours of taking this medication.

## 2017-07-31 ENCOUNTER — Ambulatory Visit (HOSPITAL_COMMUNITY)
Admission: RE | Admit: 2017-07-31 | Discharge: 2017-07-31 | Disposition: A | Discharge status: Home / Self Care | Payer: BLUE CROSS/BLUE SHIELD | Source: Ambulatory Visit | Attending: Emergency Medicine | Admitting: Emergency Medicine

## 2017-07-31 ENCOUNTER — Ambulatory Visit (HOSPITAL_COMMUNITY): Admission: RE | Admit: 2017-07-31 | Payer: BLUE CROSS/BLUE SHIELD | Source: Ambulatory Visit

## 2017-07-31 DIAGNOSIS — M79605 Pain in left leg: Secondary | ICD-10-CM | POA: Diagnosis not present

## 2017-07-31 DIAGNOSIS — I8390 Asymptomatic varicose veins of unspecified lower extremity: Secondary | ICD-10-CM | POA: Diagnosis present

## 2019-10-13 ENCOUNTER — Other Ambulatory Visit (HOSPITAL_COMMUNITY): Payer: Self-pay | Admitting: Internal Medicine

## 2019-10-13 DIAGNOSIS — R928 Other abnormal and inconclusive findings on diagnostic imaging of breast: Secondary | ICD-10-CM

## 2019-10-18 ENCOUNTER — Inpatient Hospital Stay
Admission: RE | Admit: 2019-10-18 | Discharge: 2019-10-18 | Disposition: A | Discharge status: Home / Self Care | Payer: Self-pay | Source: Ambulatory Visit | Attending: Internal Medicine | Admitting: Internal Medicine

## 2019-10-18 ENCOUNTER — Other Ambulatory Visit (HOSPITAL_COMMUNITY): Payer: Self-pay | Admitting: Internal Medicine

## 2019-10-18 DIAGNOSIS — R928 Other abnormal and inconclusive findings on diagnostic imaging of breast: Secondary | ICD-10-CM

## 2019-11-02 ENCOUNTER — Ambulatory Visit (HOSPITAL_COMMUNITY): Payer: Medicare Other

## 2019-11-02 ENCOUNTER — Inpatient Hospital Stay (HOSPITAL_COMMUNITY): Admission: RE | Admit: 2019-11-02 | Payer: BLUE CROSS/BLUE SHIELD | Source: Ambulatory Visit

## 2019-11-16 ENCOUNTER — Ambulatory Visit (HOSPITAL_COMMUNITY)
Admission: RE | Admit: 2019-11-16 | Discharge: 2019-11-16 | Disposition: A | Discharge status: Home / Self Care | Payer: Medicare Other | Source: Ambulatory Visit | Attending: Internal Medicine | Admitting: Internal Medicine

## 2019-11-16 ENCOUNTER — Other Ambulatory Visit: Payer: Self-pay

## 2019-11-16 DIAGNOSIS — R928 Other abnormal and inconclusive findings on diagnostic imaging of breast: Secondary | ICD-10-CM

## 2020-01-11 ENCOUNTER — Other Ambulatory Visit (HOSPITAL_COMMUNITY): Payer: Self-pay | Admitting: Internal Medicine

## 2020-01-11 ENCOUNTER — Other Ambulatory Visit: Payer: Self-pay | Admitting: Internal Medicine

## 2020-01-11 DIAGNOSIS — I739 Peripheral vascular disease, unspecified: Secondary | ICD-10-CM

## 2020-07-29 ENCOUNTER — Encounter (HOSPITAL_COMMUNITY): Payer: Self-pay | Admitting: *Deleted

## 2020-07-29 ENCOUNTER — Other Ambulatory Visit: Payer: Self-pay

## 2020-07-29 ENCOUNTER — Emergency Department (HOSPITAL_COMMUNITY)
Admission: EM | Admit: 2020-07-29 | Discharge: 2020-07-29 | Disposition: A | Discharge status: Home / Self Care | Payer: Medicare HMO | Attending: Emergency Medicine | Admitting: Emergency Medicine

## 2020-07-29 DIAGNOSIS — I10 Essential (primary) hypertension: Secondary | ICD-10-CM | POA: Insufficient documentation

## 2020-07-29 DIAGNOSIS — J069 Acute upper respiratory infection, unspecified: Secondary | ICD-10-CM | POA: Insufficient documentation

## 2020-07-29 DIAGNOSIS — R059 Cough, unspecified: Secondary | ICD-10-CM | POA: Diagnosis present

## 2020-07-29 DIAGNOSIS — Z79899 Other long term (current) drug therapy: Secondary | ICD-10-CM | POA: Insufficient documentation

## 2020-07-29 DIAGNOSIS — Z20822 Contact with and (suspected) exposure to covid-19: Secondary | ICD-10-CM | POA: Diagnosis not present

## 2020-07-29 MED ORDER — ACETAMINOPHEN 325 MG PO TABS
650.0000 mg | ORAL_TABLET | Freq: Once | ORAL | Status: AC
  Administered 2020-07-29: 650 mg via ORAL
  Filled 2020-07-29: qty 2

## 2020-07-29 MED ORDER — BENZONATATE 100 MG PO CAPS: 100.0000 mg | ORAL_CAPSULE | Freq: Three times a day (TID) | ORAL | 0 refills | Status: AC

## 2020-07-29 NOTE — ED Provider Notes (Signed)
[facility] EMERGENCY DEPARTMENT Provider Note  CSN: [id number] Arrival date & time: 07/29/20 1359    History Chief Complaint  Patient presents with  . Headache    HPI  Elaine Houston is a 70 y.o. female with history of HTN HLD reports about a week of cough, nasal congestion and headache. Some body aches but no fevers. She has had Covid in Jan 2021 and has since been vaccinated and boosted about a month ago. No other known sick contacts, some improvement with Coricidin.    Past Medical History:  Diagnosis Date  . Hyperlipidemia   . Hypertension   . Sigmoid diverticulosis     Past Surgical History:  Procedure Laterality Date  . CESAREAN SECTION      Family History  Problem Relation Age of Onset  . Diabetes Mother   . Arthritis Father   . Heart disease Father   . Diabetes Sister   . Diabetes Sister     Social History   Tobacco Use  . Smoking status: Never Smoker  . Smokeless tobacco: Never Used  Substance Use Topics  . Alcohol use: No  . Drug use: No     Home Medications Prior to Admission medications   Medication Sig Start Date End Date Taking? Authorizing Provider  benzonatate (TESSALON) 100 MG capsule Take 1 capsule (100 mg total) by mouth every 8 (eight) hours. 07/29/20  Yes Truddie Hidden, MD  enalapril (VASOTEC) 5 MG tablet Take 1 tablet (5 mg total) by mouth daily. 09/01/12   Alycia Rossetti, MD  hydrochlorothiazide (HYDRODIURIL) 25 MG tablet Take 1 tablet (25 mg total) by mouth daily. 09/01/12   Alycia Rossetti, MD  meloxicam (MOBIC) 15 MG tablet Take 15 mg by mouth daily.    [provider]  Multiple Vitamin (MULTIVITAMIN) tablet Take 1 tablet by mouth daily.    [provider]  naproxen sodium (ANAPROX) 220 MG tablet Take 220 mg by mouth 2 (two) times daily with a meal.    [provider]  simvastatin (ZOCOR) 40 MG tablet Take 1 tablet (40 mg total) by mouth every evening. 09/07/12   Alycia Rossetti, MD  traMADol  (ULTRAM) 50 MG tablet Take 1 tablet (50 mg total) by mouth every 6 (six) hours as needed. 07/30/17   Evalee Jefferson, PA-C     Allergies    Patient has no known allergies.   Review of Systems   Review of Systems A comprehensive review of systems was completed and negative except as noted in HPI.    Physical Exam BP (!) 158/74 (BP Location: Right Arm)   Pulse 61   Temp 98.7 F (37.1 C) (Oral)   Resp 16   Ht 5\' 2"  (1.575 m)   Wt 97.1 kg   SpO2 99%   BMI 39.14 kg/m   Physical Exam Vitals and nursing note reviewed.  Constitutional:      Appearance: Normal appearance.  HENT:     Head: Normocephalic and atraumatic.     Nose: Nose normal.     Mouth/Throat:     Mouth: Mucous membranes are moist.  Eyes:     Extraocular Movements: Extraocular movements intact.     Conjunctiva/sclera: Conjunctivae normal.  Cardiovascular:     Rate and Rhythm: Normal rate.  Pulmonary:     Effort: Pulmonary effort is normal.     Breath sounds: Normal breath sounds.  Abdominal:     General: Abdomen is flat.     Palpations:  Abdomen is soft.     Tenderness: There is no abdominal tenderness.  Musculoskeletal:        General: No swelling. Normal range of motion.     Cervical back: Neck supple.  Skin:    General: Skin is warm and dry.  Neurological:     General: No focal deficit present.     Mental Status: She is alert.  Psychiatric:        Mood and Affect: Mood normal.      ED Results / Procedures / Treatments   Labs (all labs ordered are listed, but only abnormal results are displayed) Labs Reviewed  SARS CORONAVIRUS 2 (TAT 6-24 HRS)    EKG None  Radiology No results found.  Procedures Procedures  Medications Ordered in the ED Medications  acetaminophen (TYLENOL) tablet 650 mg (has no administration in time range)     MDM Rules/Calculators/A&P MDM Patient with viral URI symptoms, normal vitals. Could have a mild Covid case vs other nonspecific viral infection. Will give  Rx for Tessalon, continue OTC meds for symptomatic care and quarantine until Covid results. PCP follow up.  ED Course  I have reviewed the triage vital signs and the nursing notes.  Pertinent labs & imaging results that were available during my care of the patient were reviewed by me and considered in my medical decision making (see chart for details).     Final Clinical Impression(s) / ED Diagnoses Final diagnoses:  Viral URI with cough    Rx / DC Orders ED Discharge Orders         Ordered    benzonatate (TESSALON) 100 MG capsule  Every 8 hours        07/29/20 1539           Truddie Hidden, MD 07/29/20 1540

## 2020-07-29 NOTE — ED Triage Notes (Signed)
Pt c/o HA with cough and congestion x 1 week. Denies any fever or N/V. Denies having a covid test done.

## 2020-07-30 LAB — SARS CORONAVIRUS 2 (TAT 6-24 HRS): SARS Coronavirus 2: NEGATIVE

## 2021-01-02 ENCOUNTER — Other Ambulatory Visit (HOSPITAL_COMMUNITY): Payer: Self-pay | Admitting: Family Medicine

## 2021-01-02 DIAGNOSIS — Z1231 Encounter for screening mammogram for malignant neoplasm of breast: Secondary | ICD-10-CM

## 2021-01-11 ENCOUNTER — Ambulatory Visit (HOSPITAL_COMMUNITY)
Admission: RE | Admit: 2021-01-11 | Discharge: 2021-01-11 | Disposition: A | Discharge status: Home / Self Care | Payer: Medicare (Managed Care) | Source: Ambulatory Visit | Attending: Family Medicine | Admitting: Family Medicine

## 2021-01-11 ENCOUNTER — Other Ambulatory Visit: Payer: Self-pay

## 2021-01-11 DIAGNOSIS — Z1231 Encounter for screening mammogram for malignant neoplasm of breast: Secondary | ICD-10-CM | POA: Diagnosis present

## 2021-06-26 ENCOUNTER — Other Ambulatory Visit (HOSPITAL_COMMUNITY): Payer: Self-pay | Admitting: Family Medicine

## 2021-06-26 DIAGNOSIS — Z13820 Encounter for screening for osteoporosis: Secondary | ICD-10-CM

## 2021-09-06 ENCOUNTER — Encounter (HOSPITAL_COMMUNITY): Payer: Self-pay

## 2021-09-06 ENCOUNTER — Emergency Department (HOSPITAL_COMMUNITY)
Admission: EM | Admit: 2021-09-06 | Discharge: 2021-09-06 | Disposition: A | Discharge status: Home / Self Care | Payer: Medicare HMO | Attending: Emergency Medicine | Admitting: Emergency Medicine

## 2021-09-06 ENCOUNTER — Other Ambulatory Visit: Payer: Self-pay

## 2021-09-06 ENCOUNTER — Emergency Department (HOSPITAL_COMMUNITY): Payer: Medicare HMO

## 2021-09-06 DIAGNOSIS — Z79899 Other long term (current) drug therapy: Secondary | ICD-10-CM | POA: Insufficient documentation

## 2021-09-06 DIAGNOSIS — R1032 Left lower quadrant pain: Secondary | ICD-10-CM | POA: Diagnosis present

## 2021-09-06 DIAGNOSIS — E876 Hypokalemia: Secondary | ICD-10-CM | POA: Diagnosis not present

## 2021-09-06 DIAGNOSIS — I10 Essential (primary) hypertension: Secondary | ICD-10-CM | POA: Diagnosis not present

## 2021-09-06 DIAGNOSIS — M79605 Pain in left leg: Secondary | ICD-10-CM | POA: Diagnosis not present

## 2021-09-06 DIAGNOSIS — K5792 Diverticulitis of intestine, part unspecified, without perforation or abscess without bleeding: Secondary | ICD-10-CM | POA: Diagnosis not present

## 2021-09-06 DIAGNOSIS — D649 Anemia, unspecified: Secondary | ICD-10-CM | POA: Diagnosis not present

## 2021-09-06 LAB — URINALYSIS, ROUTINE W REFLEX MICROSCOPIC
Bilirubin Urine: NEGATIVE
Glucose, UA: NEGATIVE mg/dL
Ketones, ur: NEGATIVE mg/dL
Nitrite: NEGATIVE
Protein, ur: NEGATIVE mg/dL
Specific Gravity, Urine: 1.012 (ref 1.005–1.030)
pH: 6 (ref 5.0–8.0)

## 2021-09-06 LAB — COMPREHENSIVE METABOLIC PANEL
ALT: 19 U/L (ref 0–44)
AST: 21 U/L (ref 15–41)
Albumin: 3.8 g/dL (ref 3.5–5.0)
Alkaline Phosphatase: 67 U/L (ref 38–126)
Anion gap: 9 (ref 5–15)
BUN: 14 mg/dL (ref 8–23)
CO2: 27 mmol/L (ref 22–32)
Calcium: 9.5 mg/dL (ref 8.9–10.3)
Chloride: 101 mmol/L (ref 98–111)
Creatinine, Ser: 0.61 mg/dL (ref 0.44–1.00)
GFR, Estimated: >60 mL/min (ref >60)
Glucose, Bld: 99 mg/dL (ref 70–99)
Potassium: 3.1 mmol/L — ABNORMAL LOW (ref 3.5–5.1)
Sodium: 137 mmol/L (ref 135–145)
Total Bilirubin: 0.4 mg/dL (ref 0.3–1.2)
Total Protein: 8.1 g/dL (ref 6.5–8.1)

## 2021-09-06 LAB — CBC WITH DIFFERENTIAL/PLATELET
Abs Immature Granulocytes: 0.04 10*3/uL (ref 0.00–0.07)
Basophils Absolute: 0 10*3/uL (ref 0.0–0.1)
Basophils Relative: 0 %
Eosinophils Absolute: 0.1 10*3/uL (ref 0.0–0.5)
Eosinophils Relative: 1 %
HCT: 35.9 % — ABNORMAL LOW (ref 36.0–46.0)
Hemoglobin: 11.5 g/dL — ABNORMAL LOW (ref 12.0–15.0)
Immature Granulocytes: 0 %
Lymphocytes Relative: 16 %
Lymphs Abs: 1.7 10*3/uL (ref 0.7–4.0)
MCH: 27.1 pg (ref 26.0–34.0)
MCHC: 32 g/dL (ref 30.0–36.0)
MCV: 84.5 fL (ref 80.0–100.0)
Monocytes Absolute: 0.7 10*3/uL (ref 0.1–1.0)
Monocytes Relative: 7 %
Neutro Abs: 7.9 10*3/uL — ABNORMAL HIGH (ref 1.7–7.7)
Neutrophils Relative %: 76 %
Platelets: 265 10*3/uL (ref 150–400)
RBC: 4.25 MIL/uL (ref 3.87–5.11)
RDW: 15.3 % (ref 11.5–15.5)
WBC: 10.5 10*3/uL (ref 4.0–10.5)
nRBC: 0 % (ref 0.0–0.2)

## 2021-09-06 LAB — LIPASE, BLOOD: Lipase: 44 U/L (ref 11–51)

## 2021-09-06 MED ORDER — METRONIDAZOLE 500 MG PO TABS: 500.0000 mg | ORAL_TABLET | Freq: Four times a day (QID) | ORAL | 0 refills | Status: AC

## 2021-09-06 MED ORDER — IOHEXOL 300 MG/ML  SOLN
100.0000 mL | Freq: Once | INTRAMUSCULAR | Status: AC | PRN
  Administered 2021-09-06: 100 mL via INTRAVENOUS

## 2021-09-06 MED ORDER — METRONIDAZOLE 500 MG PO TABS
500.0000 mg | ORAL_TABLET | Freq: Once | ORAL | Status: AC
  Administered 2021-09-06: 500 mg via ORAL
  Filled 2021-09-06: qty 1

## 2021-09-06 MED ORDER — OXYCODONE-ACETAMINOPHEN 5-325 MG PO TABS: 1.0000 | ORAL_TABLET | Freq: Four times a day (QID) | ORAL | 0 refills | Status: AC | PRN

## 2021-09-06 MED ORDER — ONDANSETRON 4 MG PO TBDP: ORAL_TABLET | ORAL | 0 refills | Status: AC

## 2021-09-06 MED ORDER — CIPROFLOXACIN HCL 500 MG PO TABS: ORAL_TABLET | ORAL | 0 refills | Status: AC

## 2021-09-06 MED ORDER — CIPROFLOXACIN HCL 250 MG PO TABS
500.0000 mg | ORAL_TABLET | Freq: Once | ORAL | Status: AC
  Administered 2021-09-06: 500 mg via ORAL
  Filled 2021-09-06: qty 2

## 2021-09-06 NOTE — ED Triage Notes (Addendum)
Reports left leg pain that starts from her groin down to knee.  Denies hx of DVT  Reports swelling.  +pedal pulse noted in triage ?

## 2021-09-06 NOTE — ED Provider Notes (Signed)
[facility] Provider Note   CSN: [id number] Arrival date & time: 09/06/21  1346     History  Chief Complaint  Patient presents with   Leg Pain    Elaine Houston is a 71 y.o. female.  Patient complains of left lower quadrant pain.  Patient has a history of hypertension  The history is provided by the patient and medical records. No language interpreter was used.  Abdominal Pain Pain location:  LLQ Pain quality: aching   Pain radiates to:  Does not radiate Pain severity:  Moderate Onset quality:  Sudden Timing:  Constant Progression:  Worsening Chronicity:  New Context: not alcohol use   Worsened by:  Nothing Associated symptoms: no chest pain, no cough, no diarrhea, no fatigue and no hematuria       Home Medications Prior to Admission medications   Medication Sig Start Date End Date Taking? Authorizing Provider  ciprofloxacin (CIPRO) 500 MG tablet One po bid 09/06/21  Yes Milton Ferguson, MD  metroNIDAZOLE (FLAGYL) 500 MG tablet Take 1 tablet (500 mg total) by mouth 4 (four) times daily. 09/06/21  Yes Milton Ferguson, MD  ondansetron (ZOFRAN-ODT) 4 MG disintegrating tablet 4mg  ODT q4 hours prn nausea/vomit 09/06/21  Yes Milton Ferguson, MD  oxyCODONE-acetaminophen (PERCOCET) 5-325 MG tablet Take 1 tablet by mouth every 6 (six) hours as needed. 09/06/21  Yes Milton Ferguson, MD  benzonatate (TESSALON) 100 MG capsule Take 1 capsule (100 mg total) by mouth every 8 (eight) hours. 07/29/20   Truddie Hidden, MD  enalapril (VASOTEC) 5 MG tablet Take 1 tablet (5 mg total) by mouth daily. 09/01/12   Alycia Rossetti, MD  hydrochlorothiazide (HYDRODIURIL) 25 MG tablet Take 1 tablet (25 mg total) by mouth daily. 09/01/12   Alycia Rossetti, MD  meloxicam (MOBIC) 15 MG tablet Take 15 mg by mouth daily.    [provider]  Multiple Vitamin (MULTIVITAMIN) tablet Take 1 tablet by mouth daily.    [provider]  naproxen sodium (ANAPROX) 220 MG tablet  Take 220 mg by mouth 2 (two) times daily with a meal.    [provider]  simvastatin (ZOCOR) 40 MG tablet Take 1 tablet (40 mg total) by mouth every evening. 09/07/12   Alycia Rossetti, MD  traMADol (ULTRAM) 50 MG tablet Take 1 tablet (50 mg total) by mouth every 6 (six) hours as needed. 07/30/17   Evalee Jefferson, PA-C      Allergies    Patient has no known allergies.    Review of Systems   Review of Systems  Constitutional:  Negative for appetite change and fatigue.  HENT:  Negative for congestion, ear discharge and sinus pressure.   Eyes:  Negative for discharge.  Respiratory:  Negative for cough.   Cardiovascular:  Negative for chest pain.  Gastrointestinal:  Positive for abdominal pain. Negative for diarrhea.  Genitourinary:  Negative for frequency and hematuria.  Musculoskeletal:  Negative for back pain.  Skin:  Negative for rash.  Neurological:  Negative for seizures and headaches.  Psychiatric/Behavioral:  Negative for hallucinations.    Physical Exam Updated Vital Signs BP (!) 159/73 (BP Location: Right Arm)    Pulse 67    Temp 98.6 F (37 C) (Oral)    Resp 18    Ht 5\' 2"  (1.575 m)    Wt 97.1 kg    SpO2 100%    BMI 39.14 kg/m  Physical Exam Vitals and nursing note reviewed.  Constitutional:  Appearance: She is well-developed.  HENT:     Head: Normocephalic.     Nose: Nose normal.  Eyes:     General: No scleral icterus.    Conjunctiva/sclera: Conjunctivae normal.  Neck:     Thyroid: No thyromegaly.  Cardiovascular:     Rate and Rhythm: Normal rate and regular rhythm.     Heart sounds: No murmur heard.   No friction rub. No gallop.  Pulmonary:     Breath sounds: No stridor. No wheezing or rales.  Chest:     Chest wall: No tenderness.  Abdominal:     General: There is no distension.     Tenderness: There is no abdominal tenderness. There is no rebound.     Comments: Minimal tenderness left lower quadrant  Musculoskeletal:        General: Normal range  of motion.     Cervical back: Neck supple.  Lymphadenopathy:     Cervical: No cervical adenopathy.  Skin:    Findings: No erythema or rash.  Neurological:     Mental Status: She is alert and oriented to person, place, and time.     Motor: No abnormal muscle tone.     Coordination: Coordination normal.  Psychiatric:        Behavior: Behavior normal.    ED Results / Procedures / Treatments   Labs (all labs ordered are listed, but only abnormal results are displayed) Labs Reviewed  CBC WITH DIFFERENTIAL/PLATELET - Abnormal; Notable for the following components:      Result Value   Hemoglobin 11.5 (*)    HCT 35.9 (*)    Neutro Abs 7.9 (*)    All other components within normal limits  COMPREHENSIVE METABOLIC PANEL - Abnormal; Notable for the following components:   Potassium 3.1 (*)    All other components within normal limits  URINALYSIS, ROUTINE W REFLEX MICROSCOPIC - Abnormal; Notable for the following components:   Color, Urine STRAW (*)    Hgb urine dipstick MODERATE (*)    Leukocytes,Ua MODERATE (*)    Bacteria, UA RARE (*)    All other components within normal limits  URINE CULTURE  LIPASE, BLOOD    EKG None  Radiology CT ABDOMEN PELVIS W CONTRAST  Result Date: 09/06/2021 CLINICAL DATA:  Abdominal pain EXAM: CT ABDOMEN AND PELVIS WITH CONTRAST TECHNIQUE: Multidetector CT imaging of the abdomen and pelvis was performed using the standard protocol following bolus administration of intravenous contrast. RADIATION DOSE REDUCTION: This exam was performed according to the departmental dose-optimization program which includes automated exposure control, adjustment of the mA and/or kV according to patient size and/or use of iterative reconstruction technique. CONTRAST:  155mL OMNIPAQUE IOHEXOL 300 MG/ML  SOLN COMPARISON:  None. FINDINGS: Lower chest: Unremarkable. Hepatobiliary: Liver measures 17.1 cm in length. No focal abnormality is seen in the liver. Gallbladder is  unremarkable. Pancreas: No focal abnormality is seen. Spleen: Unremarkable. Adrenals/Urinary Tract: Adrenals are not enlarged. There is no hydronephrosis. There are no renal or ureteral stones. Urinary bladder is unremarkable. Stomach/Bowel: Small hiatal hernia is seen. Small bowel loops are not dilated. Appendix is not dilated. There is mild wall thickening at the junction of descending and sigmoid colon in the left iliac fossa along with pericolic stranding. Multiple diverticula are seen in the colon. Trace amount of free fluid is seen in the pelvis. There is no loculated pericolic abscess. Vascular/Lymphatic: There are scattered arterial calcifications. Reproductive: Uterus is to the right of midline and retroverted. Small amount of free  fluid is seen in the cul-de-sac. Other: There is no pneumoperitoneum. There is tiny umbilical hernia containing fat. Musculoskeletal: There is mild anterolisthesis at L4-L5 level. There is spinal stenosis and encroachment of neural foramina at L4-L5 level. IMPRESSION: There is no evidence of intestinal obstruction or pneumoperitoneum. Appendix is not dilated. There is no hydronephrosis. Diverticulosis of colon. There is mild wall thickening and pericolic stranding at the junction of descending and sigmoid colon in the left iliac fossa suggesting acute diverticulitis. There is no loculated pericolic abscess. Small amount of free fluid in cul-de-sac in the pelvis may be related to acute diverticulitis. Small hiatal hernia. Lumbar spondylosis at L4-L5 level with spinal stenosis and encroachment of neural foramina. Electronically Signed   By: Elmer Picker M.D.   On: 09/06/2021 19:49   DG Hip Unilat W or Wo Pelvis 2-3 Views Left  Result Date: 09/06/2021 CLINICAL DATA:  Left leg pain radiating to left knee EXAM: DG HIP (WITH OR WITHOUT PELVIS) 2-3V LEFT COMPARISON:  07/30/2017 FINDINGS: Frontal view of the pelvis as well as frontal and frogleg lateral views of the left hip  are obtained. No fracture, subluxation, or dislocation. Joint spaces are well preserved. Sacroiliac joints are normal. Moderate degenerative changes are seen within the lower lumbar spine. IMPRESSION: 1. Unremarkable pelvis and bilateral hips. 2. Lower lumbar degenerative changes. Electronically Signed   By: Randa Ngo M.D.   On: 09/06/2021 15:17    Procedures Procedures    Medications Ordered in ED Medications  ciprofloxacin (CIPRO) tablet 500 mg (has no administration in time range)  metroNIDAZOLE (FLAGYL) tablet 500 mg (has no administration in time range)  iohexol (OMNIPAQUE) 300 MG/ML solution 100 mL (100 mLs Intravenous Contrast Given 09/06/21 1925)    ED Course/ Medical Decision Making/ A&P                           Medical Decision Making Amount and/or Complexity of Data Reviewed Labs: ordered. Radiology: ordered.  Risk Prescription drug management.  This patient presents to the ED for concern of abdominal pain, this involves an extensive number of treatment options, and is a complaint that carries with it a high risk of complications and morbidity.  The differential diagnosis includes diverticulitis, colon cancer   Co morbidities that complicate the patient evaluation  Hypertension   Additional history obtained:  Additional history obtained from patient External records from outside source obtained and reviewed including hospital record   Lab Tests:  I Ordered, and personally interpreted labs.  The pertinent results include: CBC and chemistries which show mild decrease in hemoglobin of mild decreased potassium   Imaging Studies ordered:  I ordered imaging studies including CT abdomen I independently visualized and interpreted imaging which showed diverticulitis I agree with the radiologist interpretation   Cardiac Monitoring:  The patient was maintained on a cardiac monitor.  I personally viewed and interpreted the cardiac monitored which showed an  underlying rhythm of: Normal sinus rhythm   Medicines ordered and prescription drug management:  I ordered medication including Cipro and Flagyl for diverticulitis Reevaluation of the patient after these medicines showed that the patient improved I have reviewed the patients home medicines and have made adjustments as needed   Test Considered:  None   Critical Interventions:  IV antibiotics   Consultations Obtained:  No consult  Problem List / ED Course:  Diverticulitis    Social Determinants of Health:  None  Patient with diverticulitis.  She is sent home on Cipro Flagyl Zofran and Percocet and will follow up with PCP next week        Final Clinical Impression(s) / ED Diagnoses Final diagnoses:  Diverticulitis    Rx / DC Orders ED Discharge Orders          Ordered    ciprofloxacin (CIPRO) 500 MG tablet        09/06/21 2026    metroNIDAZOLE (FLAGYL) 500 MG tablet  4 times daily        09/06/21 2026    ondansetron (ZOFRAN-ODT) 4 MG disintegrating tablet        09/06/21 2026    oxyCODONE-acetaminophen (PERCOCET) 5-325 MG tablet  Every 6 hours PRN        09/06/21 2026              Milton Ferguson, MD 09/08/21 0945

## 2021-09-06 NOTE — Discharge Instructions (Signed)
Drink plenty of fluids.  Follow-up with your family doctor next week °

## 2021-09-06 NOTE — ED Provider Triage Note (Signed)
Emergency Medicine Provider Triage Evaluation Note ? ?Elaine Houston , a 71 y.o. female  was evaluated in triage.  Pt complains of pain in her left lateral hip region which radiates into her left groin and her left lateral knee.  She denies fevers or chills, she does endorse some pain in her left lower pelvic region additionally, she denies nausea or vomiting.  Pain has been constant.  No injuries or falls. ? ?Review of Systems  ?Positive: Left lateral hip and pelvis pain. ?Negative: Nausea or vomiting, fevers or chills. ? ?Physical Exam  ?BP (!) 147/67 (BP Location: Right Arm)   Pulse 65   Temp 98.6 ?F (37 ?C) (Oral)   Resp 16   Ht 5\' 2"  (1.575 m)   Wt 97.1 kg   SpO2 99%   BMI 39.14 kg/m?  ?Gen:   Awake, no distress   ?Resp:  Normal effort  ?MSK:   Moves extremities without difficulty  ?Other:   ? ?Medical Decision Making  ?Medically screening exam initiated at 2:53 PM.  Appropriate orders placed.  Elaine Houston was informed that the remainder of the evaluation will be completed by another provider, this initial triage assessment does not replace that evaluation, and the importance of remaining in the ED until their evaluation is complete. ? ?Left hip and pelvis pain with some mild discomfort in the left lower quadrant without guarding.  She does have a history of sigmoid diverticulosis.  Afebrile, no nausea or vomiting.  I suspect this may be musculoskeletal as source, but patient may benefit from CT imaging to rule out acute diverticulitis.  Labs and plain film imaging of her left hip has been ordered.  +/- CT abdomen pelvis, would defer pending on lab results and supine exam. ?  Elaine Jefferson, PA-C ?09/06/21 1457 ? ?

## 2021-09-09 LAB — URINE CULTURE: Culture: 10000 — AB

## 2021-09-10 ENCOUNTER — Telehealth (HOSPITAL_BASED_OUTPATIENT_CLINIC_OR_DEPARTMENT_OTHER): Payer: Self-pay | Admitting: *Deleted

## 2021-09-10 NOTE — Telephone Encounter (Signed)
Post ED Visit - Positive Culture Follow-up ? ?Culture report reviewed by antimicrobial stewardship pharmacist: ?[place] Team ?'[]'$  Elenor Quinones, Pharm.D. ?'[]'$  Heide Guile, Pharm.D., BCPS AQ-ID ?'[]'$  Parks Neptune, Pharm.D., BCPS ?'[]'$  Alycia Rossetti, Pharm.D., BCPS ?'[]'$  [place], Pharm.D., BCPS, AAHIVP ?'[]'$  Legrand Como, Pharm.D., BCPS, AAHIVP ?'[]'$  Salome Arnt, PharmD, BCPS ?'[]'$  Johnnette Gourd, PharmD, BCPS ?'[]'$  Hughes Better, PharmD, BCPS ?'[]'$  Leeroy Cha, PharmD ?'[]'$  Laqueta Linden, PharmD, BCPS ?'[x]'$  Cristela Felt, PharmD ? ?[place] Team ?'[]'$  Leodis Sias, PharmD ?'[]'$  Lindell Spar, PharmD ?'[]'$  Royetta Asal, PharmD ?'[]'$  Graylin Shiver, Rph ?'[]'$  Rema Fendt) Glennon Mac, PharmD ?'[]'$  Arlyn Dunning, PharmD ?'[]'$  Netta Cedars, PharmD ?'[]'$  Dia Sitter, PharmD ?'[]'$  Leone Haven, PharmD ?'[]'$  Gretta Arab, PharmD ?'[]'$  Theodis Shove, PharmD ?'[]'$  Peggyann Juba, PharmD ?'[]'$  Reuel Boom, PharmD ? ? ?Positive urine culture ?Treated with Ciprofloxacin, organism sensitive to the same and no further patient follow-up is required at this time. ? ?Elaine Houston ?09/10/2021, 10:50 AM ?  ?

## 2021-10-03 ENCOUNTER — Ambulatory Visit: Payer: Medicare HMO

## 2021-10-03 ENCOUNTER — Ambulatory Visit: Payer: Medicare HMO | Admitting: Orthopedic Surgery

## 2021-10-03 ENCOUNTER — Encounter: Payer: Self-pay | Admitting: Orthopedic Surgery

## 2021-10-03 ENCOUNTER — Other Ambulatory Visit: Payer: Self-pay

## 2021-10-03 VITALS — BP 156/80 | HR 65 | Ht 62.0 in | Wt 208.0 lb

## 2021-10-03 DIAGNOSIS — M1712 Unilateral primary osteoarthritis, left knee: Secondary | ICD-10-CM | POA: Diagnosis not present

## 2021-10-03 MED ORDER — IBUPROFEN 600 MG PO TABS: 600.0000 mg | ORAL_TABLET | Freq: Three times a day (TID) | ORAL | 0 refills | Status: DC | PRN

## 2021-10-03 NOTE — Patient Instructions (Addendum)
Instructions Following Joint Injections ? ?In clinic today, you received an injection in one of your joints (sometimes more than one).  Occasionally, you can have some pain at the injection site, this is normal.  You can place ice at the injection site, or take over-the-counter medications such as Tylenol (acetaminophen) or Advil (ibuprofen).  Please follow all directions listed on the bottle. ? ?If your joint (knee or shoulder) becomes swollen, red or very painful, please contact the clinic for additional assistance.  ? ?Two medications were injected, including lidocaine and a steroid (often referred to as cortisone).  Lidocaine is effective almost immediately but wears off quickly.  However, the steroid can take a few days to improve your symptoms.  In some cases, it can make your pain worse for a couple of days.  Do not be concerned if this happens as it is common.  You can apply ice or take some over-the-counter medications as needed.  ? ? ? ? ? ?Knee Exercises ? ?Ask your health care provider which exercises are safe for you. Do exercises exactly as told by your health care provider and adjust them as directed. It is normal to feel mild stretching, pulling, tightness, or discomfort as you do these exercises. Stop right away if you feel sudden pain or your pain gets worse. Do not begin these exercises until told by your health care provider. ? ?Stretching and range-of-motion exercises ?These exercises warm up your muscles and joints and improve the movement and flexibility of your knee. These exercises also help to relieve pain and swelling. ? ?Knee extension, prone ?Lie on your abdomen (prone position) on a bed. ?Place your left / right knee just beyond the edge of the surface so your knee is not on the bed. You can put a towel under your left / right thigh just above your kneecap for comfort. ?Relax your leg muscles and allow gravity to straighten your knee (extension). You should feel a stretch behind your  left / right knee. ?Hold this position for 10 seconds. ?Scoot up so your knee is supported between repetitions. ?Repeat 10 times. Complete this exercise 3-4 times per week. ?    ?Knee flexion, active ?Lie on your back with both legs straight. If this causes back discomfort, bend your left / right knee so your foot is flat on the floor. ?Slowly slide your left / right heel back toward your buttocks. Stop when you feel a gentle stretch in the front of your knee or thigh (flexion). ?Hold this position for 10 seconds. ?Slowly slide your left / right heel back to the starting position. ?Repeat 10 times. Complete this exercise 3-4 times per week. ?  ?   ?Quadriceps stretch, prone ?Lie on your abdomen on a firm surface, such as a bed or padded floor. ?Bend your left / right knee and hold your ankle. If you cannot reach your ankle or pant leg, loop a belt around your foot and grab the belt instead. ?Gently pull your heel toward your buttocks. Your knee should not slide out to the side. You should feel a stretch in the front of your thigh and knee (quadriceps). ?Hold this position for 10 seconds. ?Repeat 10 times. Complete this exercise 3-4 times per week. ?  ?   ?Hamstring, supine ?Lie on your back (supine position). ?Loop a belt or towel over the ball of your left / right foot. The ball of your foot is on the walking surface, right under your toes. ?Straighten your  left / right knee and slowly pull on the belt to raise your leg until you feel a gentle stretch behind your knee (hamstring). ?Do not let your knee bend while you do this. ?Keep your other leg flat on the floor. ?Hold this position for 10 seconds. ?Repeat 10 times. Complete this exercise 3-4 times per week. ? ? ?Strengthening exercises ?These exercises build strength and endurance in your knee. Endurance is the ability to use your muscles for a long time, even after they get tired. ? ?Quadriceps, isometric ?This exercise stretches the muscles in front of your  thigh (quadriceps) without moving your knee joint (isometric). ?Lie on your back with your left / right leg extended and your other knee bent. Put a rolled towel or small pillow under your knee if told by your health care provider. ?Slowly tense the muscles in the front of your left / right thigh. You should see your kneecap slide up toward your hip or see increased dimpling just above the knee. This motion will push the back of the knee toward the floor. ?For 10 seconds, hold the muscle as tight as you can without increasing your pain. ?Relax the muscles slowly and completely. ?Repeat 10 times. Complete this exercise 3-4 times per week. ?.  ?   ?Straight leg raises ?This exercise stretches the muscles in front of your thigh (quadriceps) and the muscles that move your hips (hip flexors). ?Lie on your back with your left / right leg extended and your other knee bent. ?Tense the muscles in the front of your left / right thigh. You should see your kneecap slide up or see increased dimpling just above the knee. Your thigh may even shake a bit. ?Keep these muscles tight as you raise your leg 4-6 inches (10-15 cm) off the floor. Do not let your knee bend. ?Hold this position for 10 seconds. ?Keep these muscles tense as you lower your leg. ?Relax your muscles slowly and completely after each repetition. ?Repeat 10 times. Complete this exercise 3-4 times per week. ? ?Hamstring, isometric ?Lie on your back on a firm surface. ?Bend your left / right knee about 30 degrees. ?Dig your left / right heel into the surface as if you are trying to pull it toward your buttocks. Tighten the muscles in the back of your thighs (hamstring) to "dig" as hard as you can without increasing any pain. ?Hold this position for 10 seconds. ?Release the tension gradually and allow your muscles to relax completely for __________ seconds after each repetition. ?Repeat 10 times. Complete this exercise 3-4 times per week. ? ?Hamstring curls ?If told by  your health care provider, do this exercise while wearing ankle weights. Begin with 5 lb weights. Then increase the weight by 1 lb (0.5 kg) increments. You can also use an exercise band ?Lie on your abdomen with your legs straight. ?Bend your left / right knee as far as you can without feeling pain. Keep your hips flat against the floor. ?Hold this position for 10 seconds. ?Slowly lower your leg to the starting position. ?Repeat 10 times. Complete this exercise 3-4 times per week. ?  ?   ?Squats ?This exercise strengthens the muscles in front of your thigh and knee (quadriceps). ?Stand in front of a table, with your feet and knees pointing straight ahead. You may rest your hands on the table for balance but not for support. ?Slowly bend your knees and lower your hips like you are going to sit in a  chair. ?Keep your weight over your heels, not over your toes. ?Keep your lower legs upright so they are parallel with the table legs. ?Do not let your hips go lower than your knees. ?Do not bend lower than told by your health care provider. ?If your knee pain increases, do not bend as low. ?Hold the squat position for 10 seconds. ?Slowly push with your legs to return to standing. Do not use your hands to pull yourself to standing. ?Repeat 10 times. Complete this exercise 3-4 times per week ?. ?    ?Wall slides ?This exercise strengthens the muscles in front of your thigh and knee (quadriceps). ?Lean your back against a smooth wall or door, and walk your feet out 18-24 inches (46-61 cm) from it. ?Place your feet hip-width apart. ?Slowly slide down the wall or door until your knees bend 90 degrees. Keep your knees over your heels, not over your toes. Keep your knees in line with your hips. ?Hold this position for 10 seconds. ?Repeat 10 times. Complete this exercise 3-4 times per week. ?  ?   ?Straight leg raises ?This exercise strengthens the muscles that rotate the leg at the hip and move it away from your body (hip  abductors). ?Lie on your side with your left / right leg in the top position. Lie so your head, shoulder, knee, and hip line up. You may bend your bottom knee to help you keep your balance. ?Roll your hips slig

## 2021-10-03 NOTE — Progress Notes (Signed)
New Patient Visit ? ?Assessment: ?Elaine Houston is a 71 y.o. female with the following: ?1. Arthritis of left knee; severe with varus alignment ? ? ?Plan: ?[name] has advanced degenerative changes in her left knee.  We reviewed radiographs in clinic, which demonstrates severe degenerative changes within the left knee.  She has had progressively worsening pain for several years.  No specific injuries.  She has not had an injection in her left knee, and I recommended this in clinic today.  This was completed without issues.  Have also provided her with a prescription dose of ibuprofen.  Follow-up as needed. ? ? ?Procedure note injection Left knee joint ?  ?Verbal consent was obtained to inject the left knee joint  ?Timeout was completed to confirm the site of injection.  The skin was prepped with alcohol and ethyl chloride was sprayed at the injection site.  ?A 21-gauge needle was used to inject 40 mg of Depo-Medrol and 1% lidocaine (3 cc) into the left knee using an anterolateral approach.  ?There were no complications. A sterile bandage was applied. ?  ? ? ?Follow-up: ?Return if symptoms worsen or fail to improve. ? ?Subjective: ? ?Chief Complaint  ?Patient presents with  ? Knee Pain  ?  Lt knee pain for years but has been getting worse. States she had a fall years ago but no new injuries.   ? ? ?History of Present Illness: ?Elaine Houston is a 71 y.o. female who has been referred by  Westley Chandler, FNP for evaluation of left knee pain.  She has had progressively worsening left knee pain for several years.  No specific injury.  She did note a fall several years ago.  She has been taking ibuprofen as needed, and she does notice some relief of her symptoms.  She is not using an assistive device.  She has never had an injection in her left knee.  She has noted swelling around the left knee, as well as into her foot and ankle.  This tends to improve overnight.  Her pain is diffuse, but localizes it to the  medial aspect of her left knee. ? ? ?Review of Systems: ?No fevers or chills ?No numbness or tingling ?No chest pain ?No shortness of breath ?No bowel or bladder dysfunction ?No GI distress ?No headaches ? ? ?Medical History: ? ?Past Medical History:  ?Diagnosis Date  ? Hyperlipidemia   ? Hypertension   ? Sigmoid diverticulosis   ? ? ?Past Surgical History:  ?Procedure Laterality Date  ? CESAREAN SECTION    ? ? ?Family History  ?Problem Relation Age of Onset  ? Diabetes Mother   ? Arthritis Father   ? Heart disease Father   ? Diabetes Sister   ? Diabetes Sister   ? ?Social History  ? ?Tobacco Use  ? Smoking status: Never  ? Smokeless tobacco: Never  ?Substance Use Topics  ? Alcohol use: No  ? Drug use: No  ? ? ?No Known Allergies ? ?Current Meds  ?Medication Sig  ? ibuprofen (ADVIL) 600 MG tablet Take 1 tablet (600 mg total) by mouth every 8 (eight) hours as needed.  ? ? ?Objective: ?BP (!) 156/80   Pulse 65   Ht '5\' 2"'$  (1.575 m)   Wt 208 lb (94.3 kg)   BMI 38.04 kg/m?  ? ?Physical Exam: ? ?General: Alert and oriented. and No acute distress. ?Gait: Left sided antalgic gait. ? ?Evaluation left knee demonstrates a mild effusion.  Varus  alignment overall.  Just short of extension, but flexion is limited to approximately 90 degrees.  Negative Lachman.  No increased laxity to varus or valgus stress. ? ?IMAGING: ?I personally ordered and reviewed the following images ? ?X-rays of the left knee were obtained in clinic today.  There is a varus alignment overall within the left knee.  Mild degenerative changes are noted throughout the right knee on a single AP view.  Severe medial compartment arthritis within the left knee.  Complete loss of joint space, with underlying subchondral cysts.  Minimal joint space within the lateral compartment.  There is loss of joint space, with some associated osteophytes within the patellofemoral compartment. ? ?Impression: Severe left knee arthritis, with varus angulation, complete loss  of joint space within the medial compartment. ? ? ?New Medications:  ?Meds ordered this encounter  ?Medications  ? ibuprofen (ADVIL) 600 MG tablet  ?  Sig: Take 1 tablet (600 mg total) by mouth every 8 (eight) hours as needed.  ?  Dispense:  60 tablet  ?  Refill:  0  ? ? ? ? ?Mordecai Rasmussen, MD ? ?10/03/2021 ?9:47 AM ? ? ?

## 2021-11-12 ENCOUNTER — Telehealth: Payer: Self-pay | Admitting: Orthopedic Surgery

## 2021-11-12 NOTE — Telephone Encounter (Signed)
Patient came in requesting refill for her  ? ?ibuprofen (ADVIL) 600 MG tablet ? ?Pharmacy:  [facility] in [place]  ?

## 2021-11-14 ENCOUNTER — Other Ambulatory Visit: Payer: Self-pay | Admitting: Orthopedic Surgery

## 2021-12-25 ENCOUNTER — Other Ambulatory Visit: Payer: Self-pay | Admitting: Orthopedic Surgery

## 2021-12-25 MED ORDER — IBUPROFEN 600 MG PO TABS: 600.0000 mg | ORAL_TABLET | Freq: Three times a day (TID) | ORAL | 0 refills | Status: DC | PRN

## 2021-12-25 NOTE — Telephone Encounter (Signed)
Patient came by office to request refill:  [facility] for medication ibuprofen (ADVIL) 600 MG tablet

## 2022-01-09 ENCOUNTER — Encounter: Payer: Self-pay | Admitting: Orthopedic Surgery

## 2022-01-09 ENCOUNTER — Ambulatory Visit (INDEPENDENT_AMBULATORY_CARE_PROVIDER_SITE_OTHER): Payer: Medicare HMO | Admitting: Orthopedic Surgery

## 2022-01-09 VITALS — BP 165/78 | HR 51 | Ht 62.0 in | Wt 211.0 lb

## 2022-01-09 DIAGNOSIS — M1712 Unilateral primary osteoarthritis, left knee: Secondary | ICD-10-CM

## 2022-01-09 NOTE — Patient Instructions (Signed)

## 2022-01-09 NOTE — Progress Notes (Signed)
Return Patient Visit  Assessment: Elaine Houston is a 71 y.o. female with the following: 1. Arthritis of left knee; severe with varus alignment   Plan: Elaine Houston has advanced degenerative changes in her left knee.  Pain is primarily within the medial aspect of the knee.  Prior injection lasted several months.  She is interested in an injection again.  We briefly discussed knee replacement, and I informed her that I would have her see Dr. Aline Brochure if she is interested in discussing this procedure in more detail.  Procedure note injection Left knee joint   Verbal consent was obtained to inject the left knee joint  Timeout was completed to confirm the site of injection.  The skin was prepped with alcohol and ethyl chloride was sprayed at the injection site.  A 21-gauge needle was used to inject 40 mg of Depo-Medrol and 1% lidocaine (3 cc) into the left knee using an anterolateral approach.  There were no complications. A sterile bandage was applied.     Follow-up: Return if symptoms worsen or fail to improve.  Subjective:  Chief Complaint  Patient presents with   Knee Pain    Left, request injection    History of Present Illness: Elaine Houston is a 71 y.o. female who returns to clinic today for repeat evaluation of her left knee.  I saw her in clinic several months ago, at which time we injected the left knee.  This provided excellent relief.  It started to wear off over the last couple of weeks.  She states that the ibuprofen does help with her knee pain as well.  Pain is primarily within the medial aspect of the knee, with some pain in the posterior aspect of the knee as well.  No change in her health.   Review of Systems: No fevers or chills No numbness or tingling No chest pain No shortness of breath No bowel or bladder dysfunction No GI distress No headaches    Objective: BP (!) 165/78   Pulse (!) 51   Ht '5\' 2"'$  (1.575 m)   Wt 211 lb (95.7 kg)   BMI  38.59 kg/m   Physical Exam:  General: Alert and oriented. and No acute distress. Gait: Left sided antalgic gait.  Left knee with varus alignment.  Mild effusion.  No increased laxity varus or valgus stress.  Negative Lachman.  Tenderness to palpation over the medial joint line.  Positive crepitus with range of motion testing.  Flexion is limited to approximately 90 degrees due to pain.  IMAGING: I personally ordered and reviewed the following images  No new imaging obtained today    New Medications:  No orders of the defined types were placed in this encounter.     Mordecai Rasmussen, MD  01/09/2022 8:52 AM

## 2022-01-31 ENCOUNTER — Telehealth: Payer: Self-pay | Admitting: Orthopedic Surgery

## 2022-01-31 NOTE — Telephone Encounter (Signed)
Patient came in the office request refill on her   ibuprofen (ADVIL) 600 MG tablet  Pharmacy [facility] in [place]

## 2022-02-05 ENCOUNTER — Other Ambulatory Visit: Payer: Self-pay | Admitting: Orthopedic Surgery

## 2022-03-12 ENCOUNTER — Other Ambulatory Visit: Payer: Self-pay | Admitting: Orthopedic Surgery

## 2022-03-12 MED ORDER — IBUPROFEN 600 MG PO TABS: 600.0000 mg | ORAL_TABLET | Freq: Three times a day (TID) | ORAL | 0 refills | Status: DC | PRN

## 2022-03-12 NOTE — Telephone Encounter (Signed)
Patient came in the office to request a refill on her medicine   ibuprofen (ADVIL) 600 MG tablet  Pharmacy: [facility] in [place]

## 2022-04-05 ENCOUNTER — Other Ambulatory Visit: Payer: Self-pay | Admitting: Orthopedic Surgery

## 2022-10-01 IMAGING — CT CT ABD-PELV W/ CM
2 of 5 series · 16 of 46 positions shown, 18 images · IV contrast (Omnipaque or Isovue)
Comparison: None.

CLINICAL DATA: Abdominal pain

EXAM:
CT ABDOMEN AND PELVIS WITH CONTRAST
TECHNIQUE: Multidetector CT imaging of the abdomen and pelvis was performed
using the standard protocol following bolus administration of
intravenous contrast.

[Series 2: axial st · axial · 0.78mm/px · z∈[+662,+1042]mm · 13 of 88 slices shown, 15 images]
[im 6/88  soft-tissue]
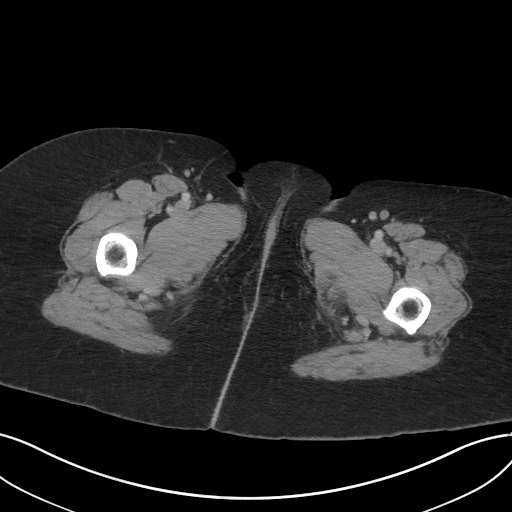
[im 6/88  bone]
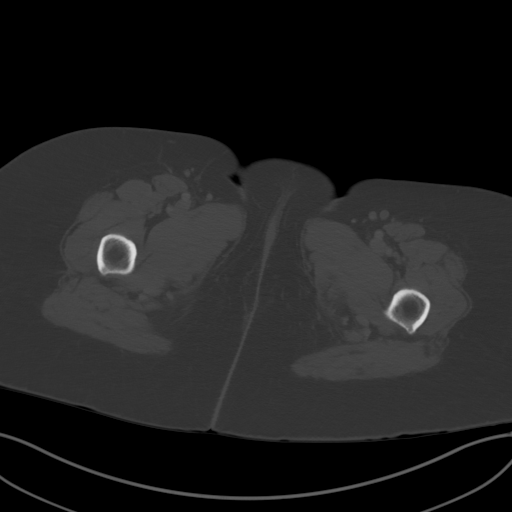
[im 11/88  soft-tissue]
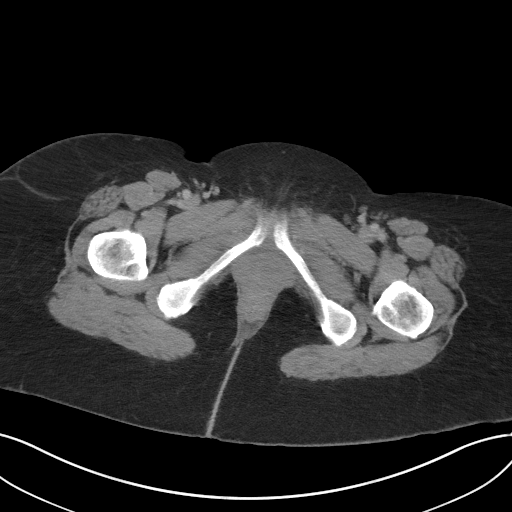
[im 21/88  soft-tissue]
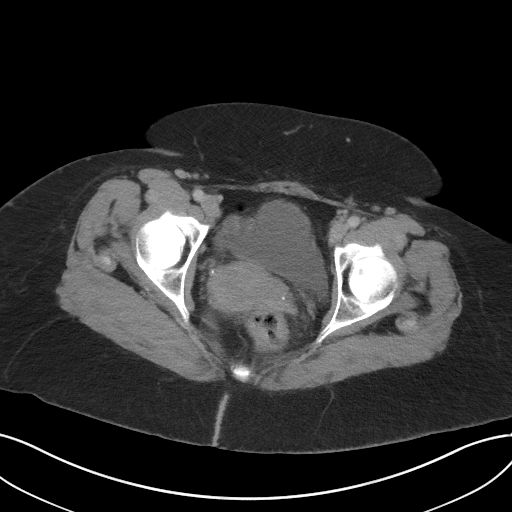
[im 26/88  soft-tissue]
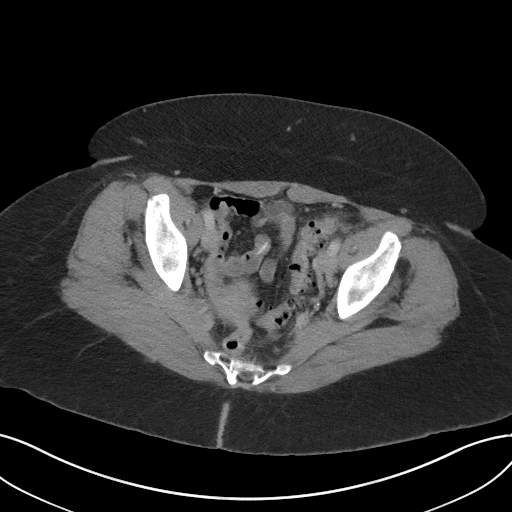
[im 31/88  soft-tissue]
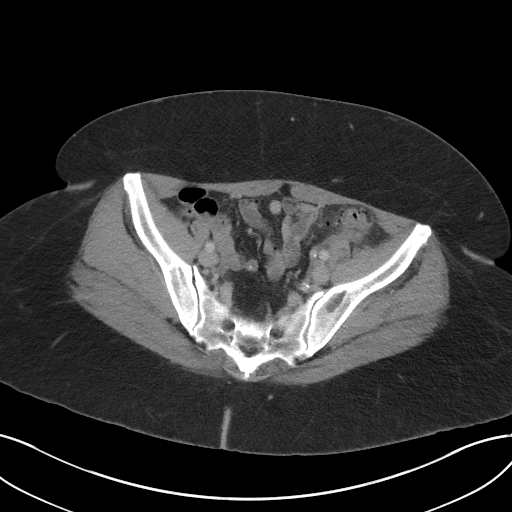
[im 36/88  soft-tissue]
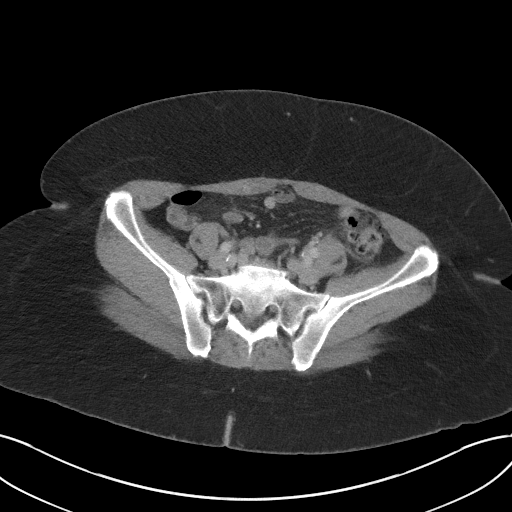
[im 47/88  soft-tissue]
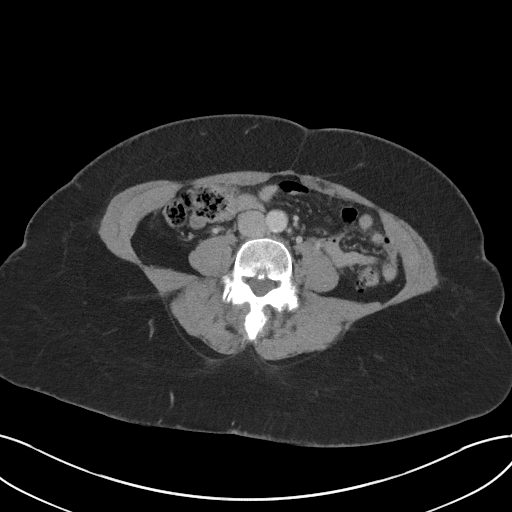
[im 52/88  soft-tissue]
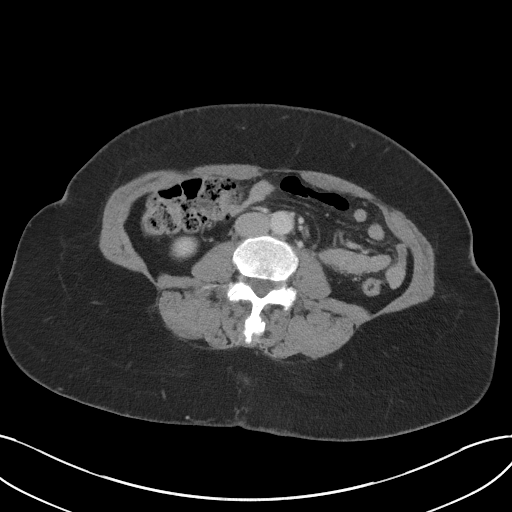
[im 57/88  soft-tissue]
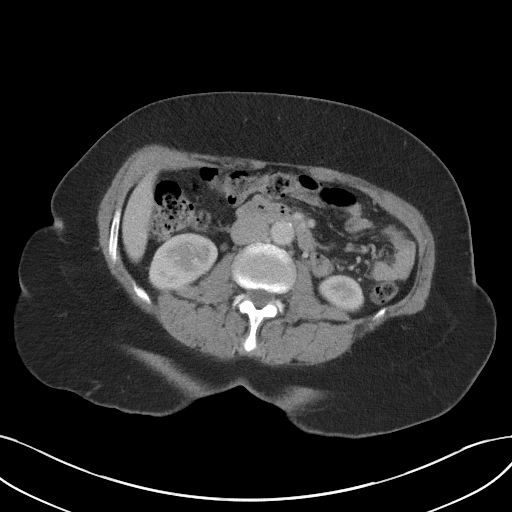
[im 57/88  bone]
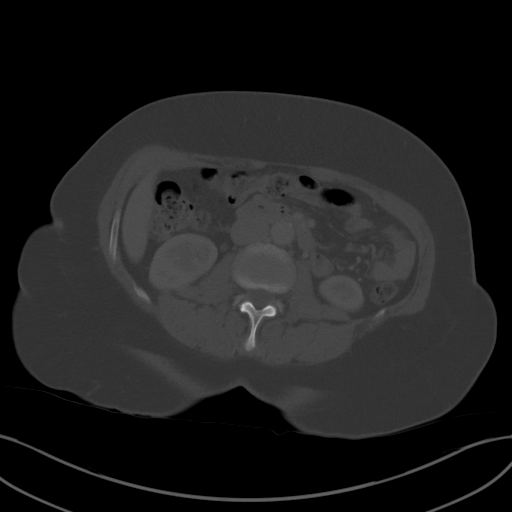
[im 62/88  soft-tissue]
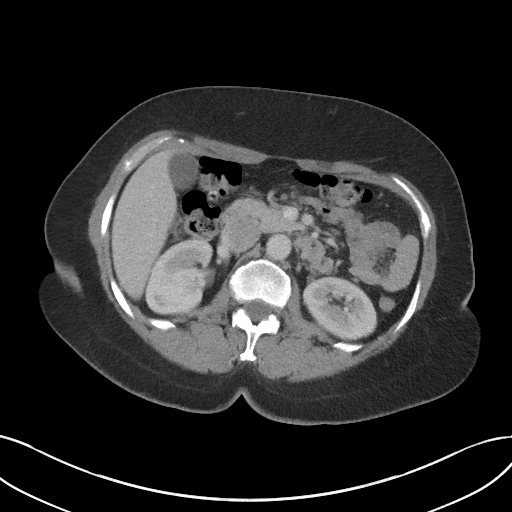
[im 67/88  soft-tissue]
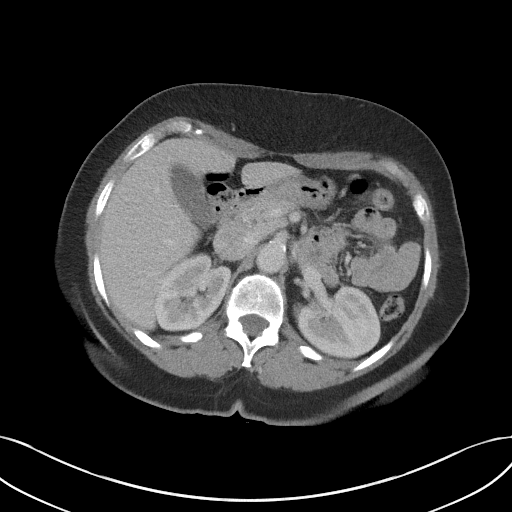
[im 77/88  soft-tissue]
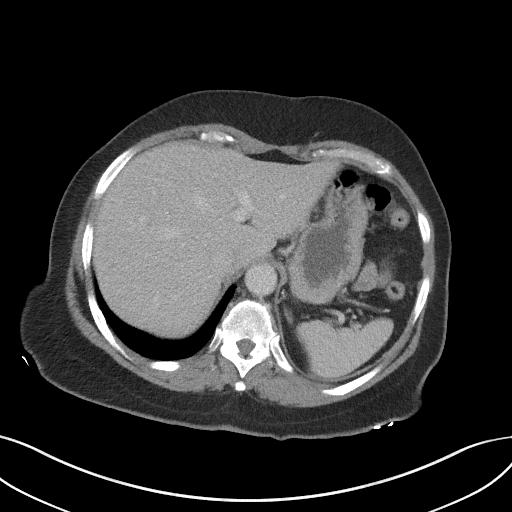
[im 82/88  soft-tissue]
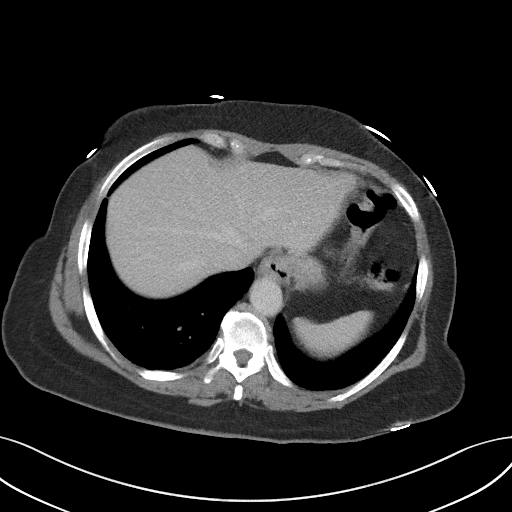

[Series 5: coronal st · coronal · 0.79mm/px · 3 of 102 slices shown]
[im 34/102  soft-tissue]
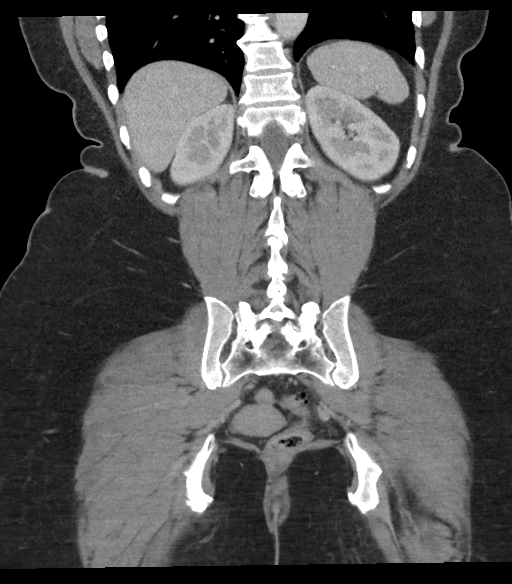
[im 45/102  soft-tissue]
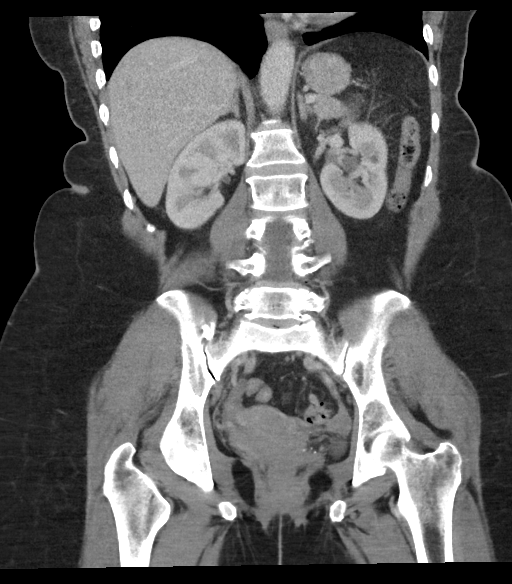
[im 57/102  soft-tissue]
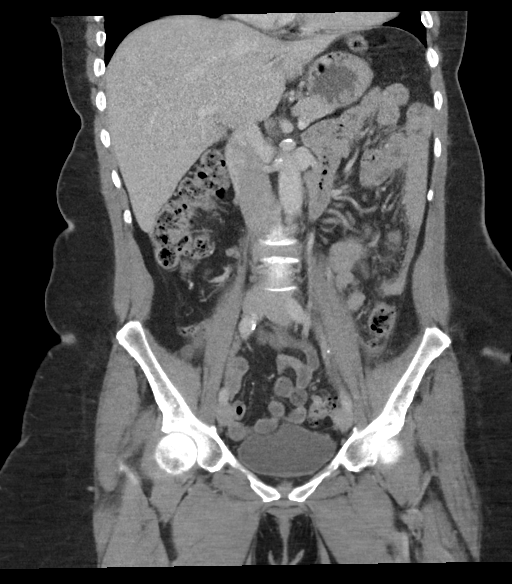

[16 of 46 positions shown; findings below may reference images not displayed]

RADIATION DOSE REDUCTION: This exam was performed according to the
departmental dose-optimization program which includes automated
exposure control, adjustment of the mA and/or kV according to
patient size and/or use of iterative reconstruction technique.

CONTRAST:  100mL OMNIPAQUE IOHEXOL 300 MG/ML  SOLN
FINDINGS: Lower chest: Unremarkable.

Hepatobiliary: Liver measures 17.1 cm in length. No focal
abnormality is seen in the liver. Gallbladder is unremarkable.

Pancreas: No focal abnormality is seen.

Spleen: Unremarkable.

Adrenals/Urinary Tract: Adrenals are not enlarged. There is no
hydronephrosis. There are no renal or ureteral stones. Urinary
bladder is unremarkable.

Stomach/Bowel: Small hiatal hernia is seen. Small bowel loops are
not dilated. Appendix is not dilated. There is mild wall thickening
at the junction of descending and sigmoid colon in the left iliac
fossa along with pericolic stranding. Multiple diverticula are seen
in the colon. Trace amount of free fluid is seen in the pelvis.
There is no loculated pericolic abscess.

Vascular/Lymphatic: There are scattered arterial calcifications.

Reproductive: Uterus is to the right of midline and retroverted.
Small amount of free fluid is seen in the cul-de-sac.

Other: There is no pneumoperitoneum. There is tiny umbilical hernia
containing fat.

Musculoskeletal: There is mild anterolisthesis at L4-L5 level. There
is spinal stenosis and encroachment of neural foramina at L4-L5
level.
IMPRESSION: There is no evidence of intestinal obstruction or pneumoperitoneum.
Appendix is not dilated. There is no hydronephrosis.

Diverticulosis of colon. There is mild wall thickening and pericolic
stranding at the junction of descending and sigmoid colon in the
left iliac fossa suggesting acute diverticulitis. There is no
loculated pericolic abscess. Small amount of free fluid in
cul-de-sac in the pelvis may be related to acute diverticulitis.

Small hiatal hernia. Lumbar spondylosis at L4-L5 level with spinal
stenosis and encroachment of neural foramina.

## 2022-10-01 IMAGING — DX DG HIP (WITH OR WITHOUT PELVIS) 2-3V*L*
3 series · 3 of 3 positions shown · non-contrast
Comparison: 07/30/2017

CLINICAL DATA: Left leg pain radiating to left knee

EXAM:
DG HIP (WITH OR WITHOUT PELVIS) 2-3V LEFT

[pelvis ap]
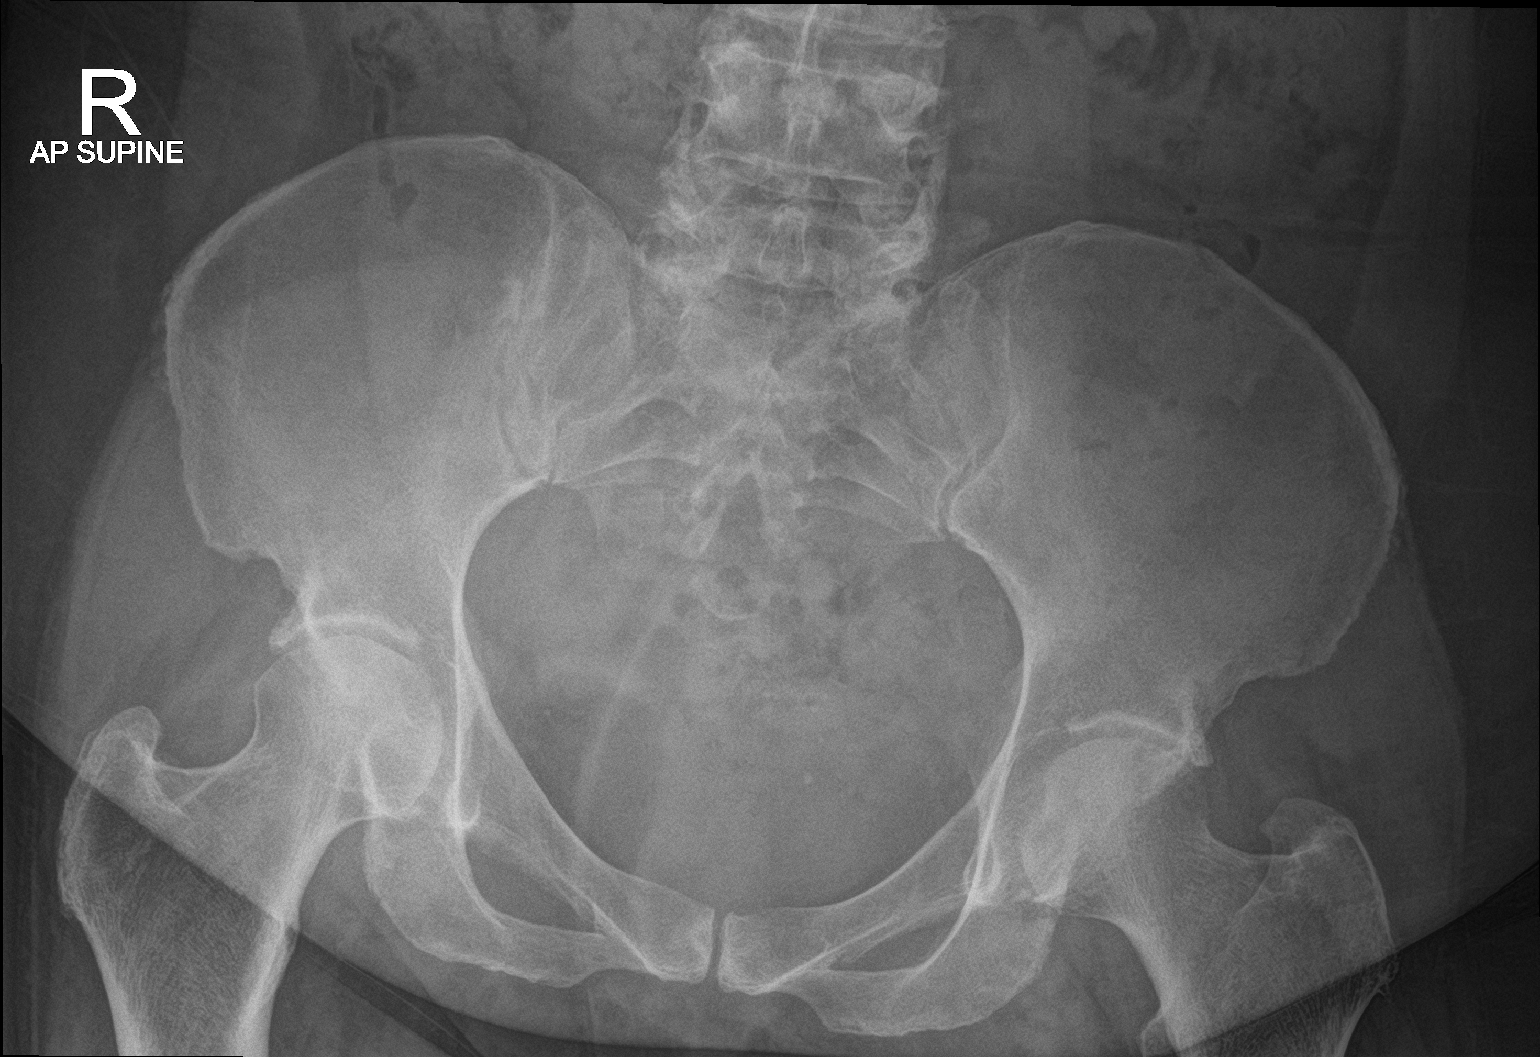

[hip ap]
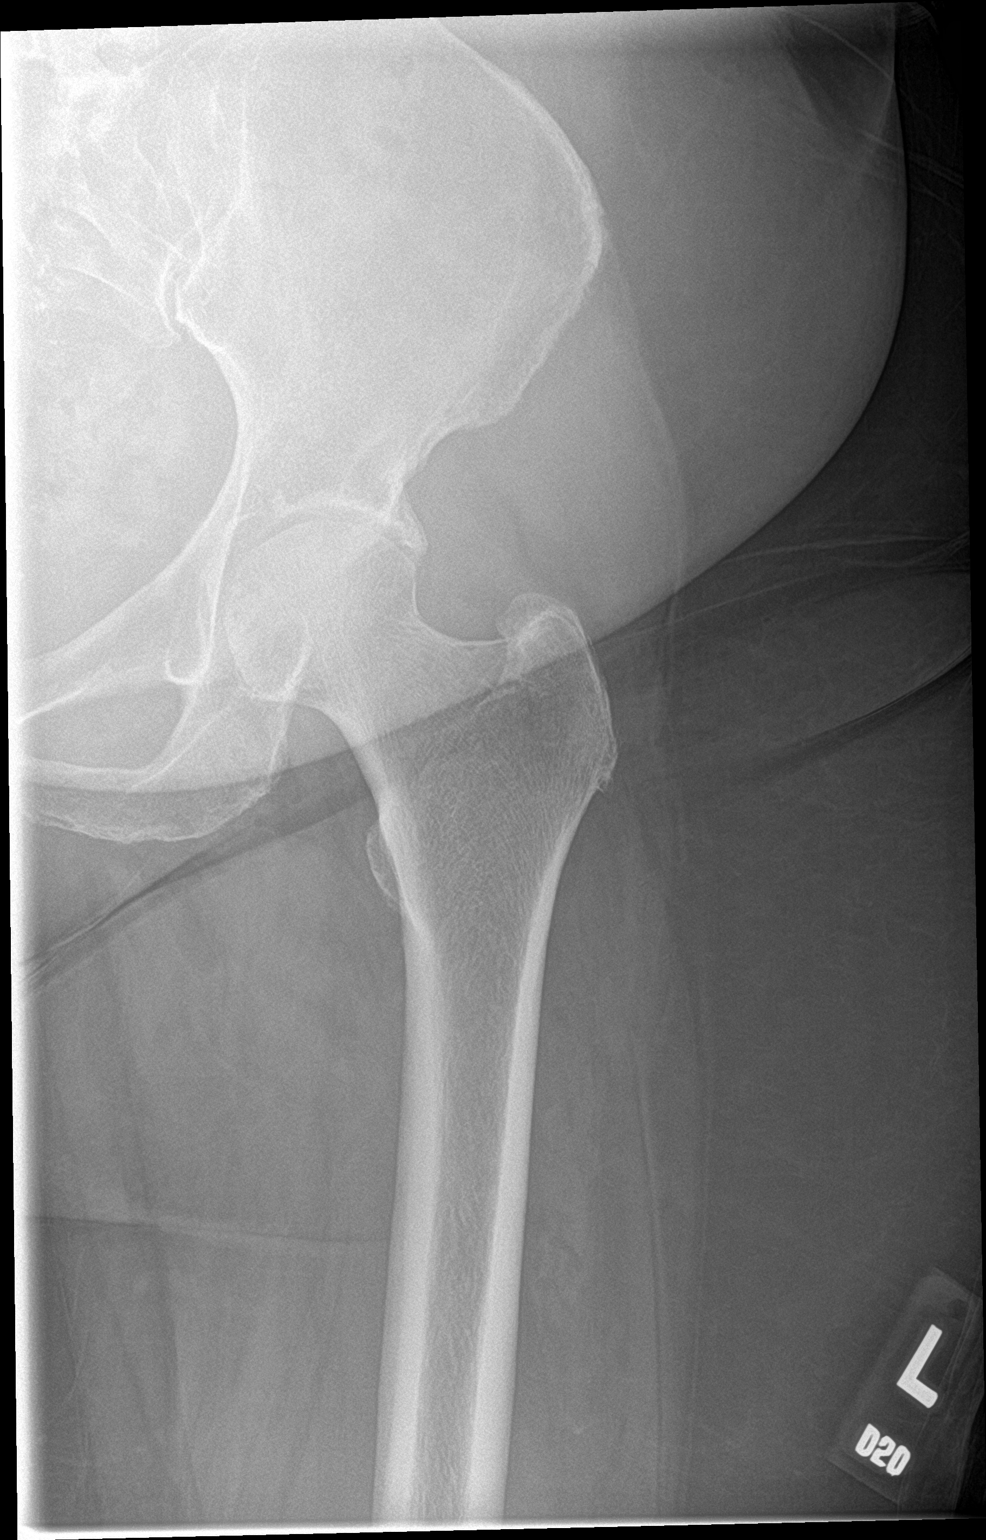

[hip frog leg]
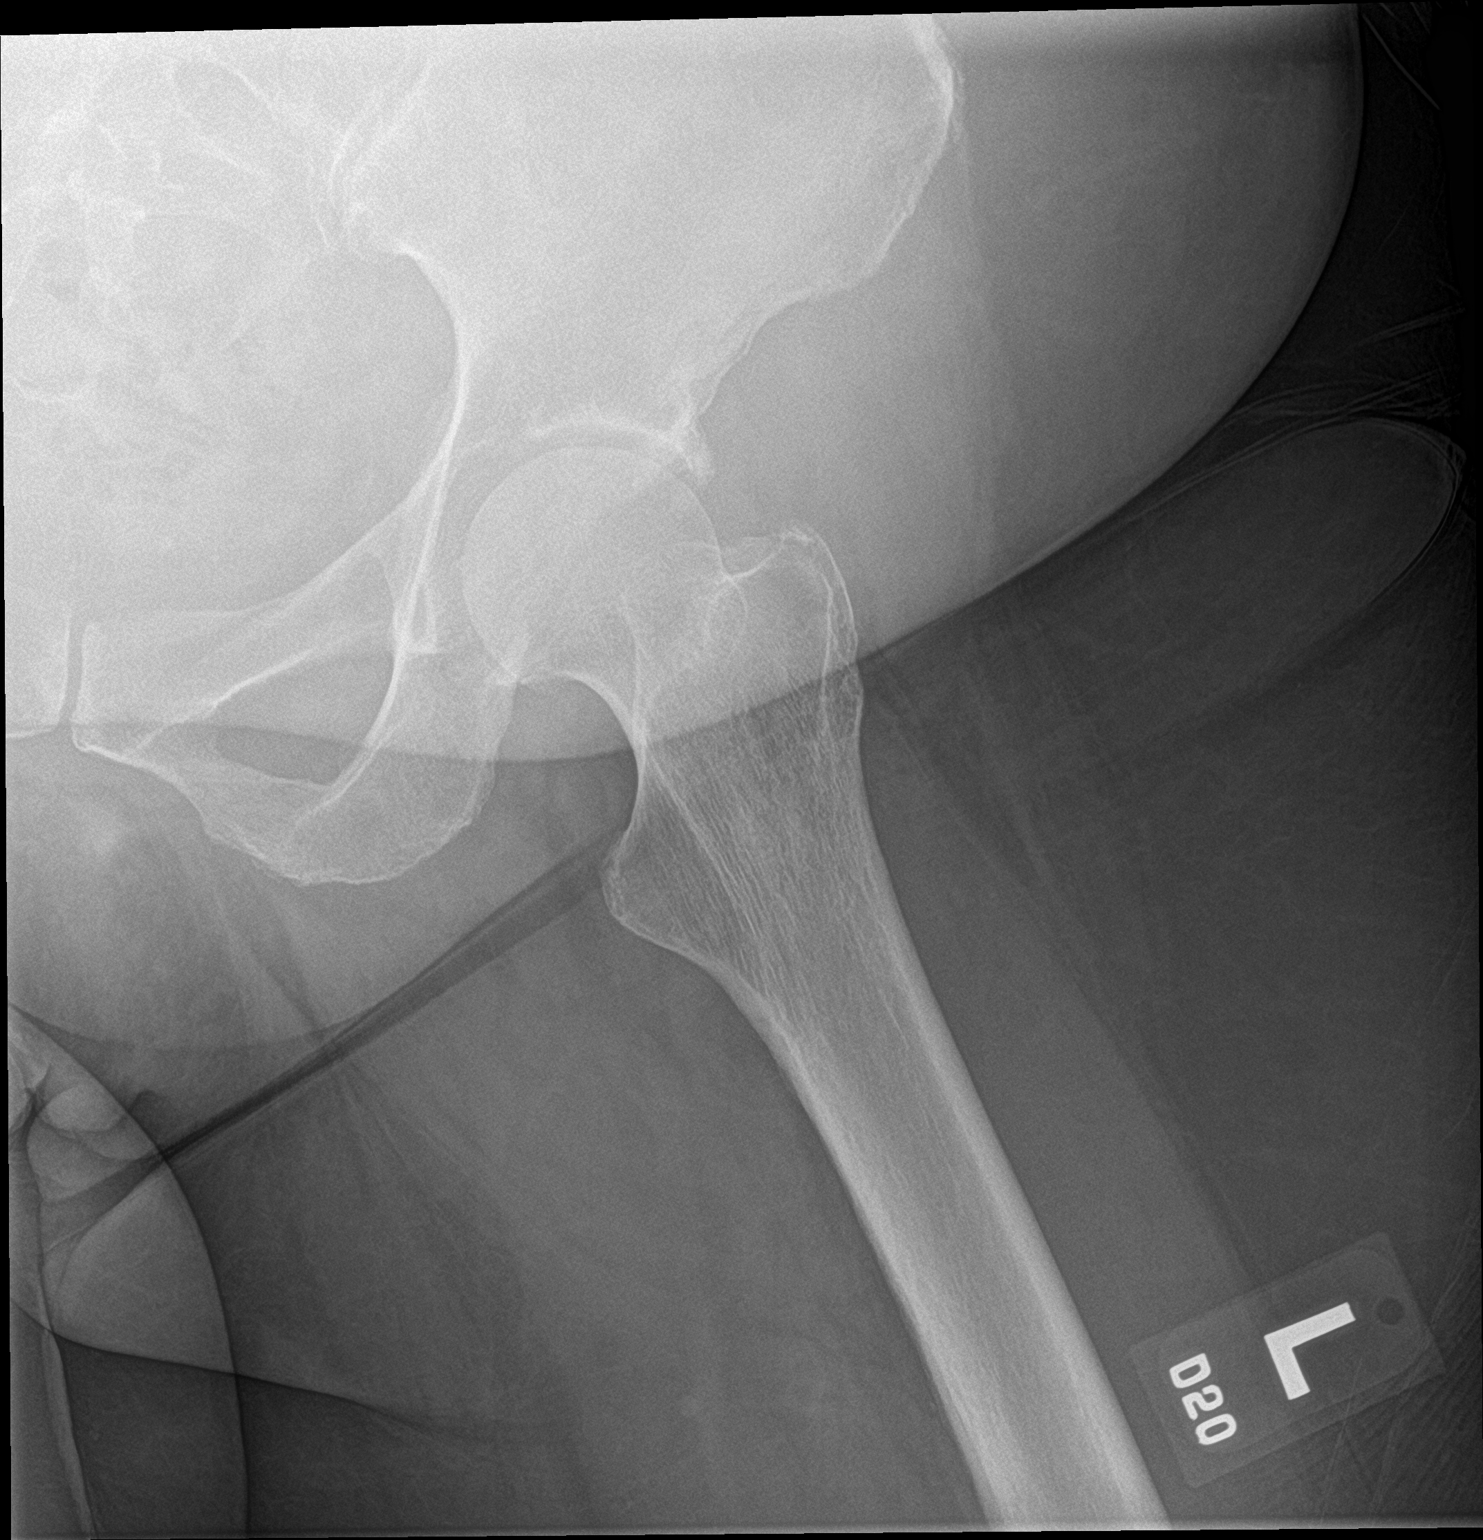

[3 of 3 positions shown; findings below may reference images not displayed]

FINDINGS: Frontal view of the pelvis as well as frontal and frogleg lateral
views of the left hip are obtained. No fracture, subluxation, or
dislocation. Joint spaces are well preserved. Sacroiliac joints are
normal. Moderate degenerative changes are seen within the lower
lumbar spine.
IMPRESSION: 1. Unremarkable pelvis and bilateral hips.
2. Lower lumbar degenerative changes.

## 2022-10-10 ENCOUNTER — Other Ambulatory Visit (HOSPITAL_COMMUNITY): Payer: Self-pay | Admitting: Family Medicine

## 2022-10-10 DIAGNOSIS — Z1231 Encounter for screening mammogram for malignant neoplasm of breast: Secondary | ICD-10-CM

## 2023-05-13 LAB — EXTERNAL GENERIC LAB PROCEDURE: COLOGUARD: NEGATIVE

## 2023-10-15 ENCOUNTER — Other Ambulatory Visit (HOSPITAL_COMMUNITY): Payer: Self-pay | Admitting: Family Medicine

## 2023-10-15 DIAGNOSIS — Z1231 Encounter for screening mammogram for malignant neoplasm of breast: Secondary | ICD-10-CM

## 2023-10-29 ENCOUNTER — Encounter (HOSPITAL_COMMUNITY): Payer: Self-pay

## 2023-10-29 ENCOUNTER — Ambulatory Visit (HOSPITAL_COMMUNITY)
Admission: RE | Admit: 2023-10-29 | Discharge: 2023-10-29 | Disposition: A | Discharge status: Home / Self Care | Source: Ambulatory Visit | Attending: Family Medicine | Admitting: Family Medicine

## 2023-10-29 DIAGNOSIS — Z1231 Encounter for screening mammogram for malignant neoplasm of breast: Secondary | ICD-10-CM | POA: Insufficient documentation
